# Patient Record
Sex: Female | Born: 1950 | Race: White | Hispanic: No | Smoking: Current some day smoker
Health system: Southern US, Community
[De-identification: ages and names within clinical notes are randomized; demographics above are authoritative.]

## PROBLEM LIST (undated history)

## (undated) DIAGNOSIS — J449 Chronic obstructive pulmonary disease, unspecified: Secondary | ICD-10-CM

## (undated) DIAGNOSIS — R270 Ataxia, unspecified: Secondary | ICD-10-CM

## (undated) DIAGNOSIS — I10 Essential (primary) hypertension: Secondary | ICD-10-CM

## (undated) DIAGNOSIS — W19XXXA Unspecified fall, initial encounter: Secondary | ICD-10-CM

## (undated) DIAGNOSIS — M797 Fibromyalgia: Secondary | ICD-10-CM

## (undated) DIAGNOSIS — G8929 Other chronic pain: Secondary | ICD-10-CM

## (undated) DIAGNOSIS — R51 Headache: Secondary | ICD-10-CM

## (undated) DIAGNOSIS — M1990 Unspecified osteoarthritis, unspecified site: Secondary | ICD-10-CM

## (undated) DIAGNOSIS — M5382 Other specified dorsopathies, cervical region: Secondary | ICD-10-CM

## (undated) DIAGNOSIS — R519 Headache, unspecified: Secondary | ICD-10-CM

## (undated) DIAGNOSIS — R296 Repeated falls: Secondary | ICD-10-CM

## (undated) DIAGNOSIS — M6281 Muscle weakness (generalized): Secondary | ICD-10-CM

## (undated) HISTORY — DX: Unspecified osteoarthritis, unspecified site: M19.90

## (undated) HISTORY — DX: Fibromyalgia: M79.7

## (undated) HISTORY — DX: Essential (primary) hypertension: I10

---

## 1980-09-15 HISTORY — PX: CHOLECYSTECTOMY: SHX55

## 2001-10-26 ENCOUNTER — Encounter: Payer: Self-pay | Admitting: Family Medicine

## 2001-10-26 ENCOUNTER — Ambulatory Visit (HOSPITAL_COMMUNITY): Admission: RE | Admit: 2001-10-26 | Discharge: 2001-10-26 | Payer: Self-pay | Admitting: Family Medicine

## 2002-01-06 ENCOUNTER — Emergency Department (HOSPITAL_COMMUNITY): Admission: EM | Admit: 2002-01-06 | Discharge: 2002-01-06 | Payer: Self-pay | Admitting: Emergency Medicine

## 2002-01-06 ENCOUNTER — Encounter: Payer: Self-pay | Admitting: Emergency Medicine

## 2002-07-05 ENCOUNTER — Encounter (HOSPITAL_COMMUNITY): Admission: RE | Admit: 2002-07-05 | Discharge: 2002-08-04 | Payer: Self-pay | Admitting: Internal Medicine

## 2003-01-02 ENCOUNTER — Encounter: Payer: Self-pay | Admitting: Family Medicine

## 2003-01-02 ENCOUNTER — Ambulatory Visit (HOSPITAL_COMMUNITY): Admission: RE | Admit: 2003-01-02 | Discharge: 2003-01-02 | Payer: Self-pay | Admitting: Family Medicine

## 2003-12-12 ENCOUNTER — Ambulatory Visit (HOSPITAL_COMMUNITY): Admission: RE | Admit: 2003-12-12 | Discharge: 2003-12-12 | Payer: Self-pay | Admitting: Family Medicine

## 2005-03-18 ENCOUNTER — Emergency Department (HOSPITAL_COMMUNITY): Admission: EM | Admit: 2005-03-18 | Discharge: 2005-03-18 | Payer: Self-pay | Admitting: Emergency Medicine

## 2005-03-21 ENCOUNTER — Ambulatory Visit (HOSPITAL_COMMUNITY): Admission: RE | Admit: 2005-03-21 | Discharge: 2005-03-21 | Payer: Self-pay | Admitting: Family Medicine

## 2005-03-24 ENCOUNTER — Ambulatory Visit: Payer: Self-pay | Admitting: Orthopedic Surgery

## 2005-05-05 ENCOUNTER — Ambulatory Visit: Payer: Self-pay | Admitting: Orthopedic Surgery

## 2005-07-14 ENCOUNTER — Ambulatory Visit: Payer: Self-pay | Admitting: Orthopedic Surgery

## 2005-07-27 ENCOUNTER — Emergency Department (HOSPITAL_COMMUNITY): Admission: EM | Admit: 2005-07-27 | Discharge: 2005-07-27 | Payer: Self-pay | Admitting: Emergency Medicine

## 2005-07-28 ENCOUNTER — Ambulatory Visit: Payer: Self-pay | Admitting: Orthopedic Surgery

## 2005-09-01 ENCOUNTER — Ambulatory Visit: Payer: Self-pay | Admitting: Orthopedic Surgery

## 2005-10-20 ENCOUNTER — Ambulatory Visit: Payer: Self-pay | Admitting: Orthopedic Surgery

## 2005-12-31 ENCOUNTER — Ambulatory Visit (HOSPITAL_COMMUNITY): Admission: RE | Admit: 2005-12-31 | Discharge: 2005-12-31 | Payer: Self-pay | Admitting: Family Medicine

## 2006-01-19 ENCOUNTER — Ambulatory Visit: Payer: Self-pay | Admitting: Orthopedic Surgery

## 2006-02-18 ENCOUNTER — Ambulatory Visit: Payer: Self-pay | Admitting: Internal Medicine

## 2006-02-18 ENCOUNTER — Emergency Department (HOSPITAL_COMMUNITY): Admission: EM | Admit: 2006-02-18 | Discharge: 2006-02-18 | Payer: Self-pay | Admitting: Emergency Medicine

## 2006-03-02 ENCOUNTER — Ambulatory Visit (HOSPITAL_COMMUNITY): Admission: RE | Admit: 2006-03-02 | Discharge: 2006-03-02 | Payer: Self-pay | Admitting: Family Medicine

## 2006-04-06 ENCOUNTER — Ambulatory Visit: Payer: Self-pay | Admitting: Orthopedic Surgery

## 2006-08-24 ENCOUNTER — Ambulatory Visit: Payer: Self-pay | Admitting: Orthopedic Surgery

## 2007-05-05 ENCOUNTER — Ambulatory Visit (HOSPITAL_COMMUNITY): Admission: RE | Admit: 2007-05-05 | Discharge: 2007-05-05 | Payer: Self-pay | Admitting: Family Medicine

## 2007-10-04 ENCOUNTER — Ambulatory Visit (HOSPITAL_COMMUNITY): Admission: RE | Admit: 2007-10-04 | Discharge: 2007-10-04 | Payer: Self-pay | Admitting: Family Medicine

## 2008-10-02 ENCOUNTER — Ambulatory Visit: Payer: Self-pay | Admitting: Orthopedic Surgery

## 2008-10-02 DIAGNOSIS — M25519 Pain in unspecified shoulder: Secondary | ICD-10-CM

## 2008-10-02 DIAGNOSIS — M7580 Other shoulder lesions, unspecified shoulder: Secondary | ICD-10-CM

## 2008-10-09 ENCOUNTER — Telehealth: Payer: Self-pay | Admitting: Orthopedic Surgery

## 2008-12-12 ENCOUNTER — Ambulatory Visit (HOSPITAL_COMMUNITY): Admission: RE | Admit: 2008-12-12 | Discharge: 2008-12-12 | Payer: Self-pay | Admitting: Family Medicine

## 2009-02-19 ENCOUNTER — Ambulatory Visit (HOSPITAL_COMMUNITY): Admission: RE | Admit: 2009-02-19 | Discharge: 2009-02-19 | Payer: Self-pay | Admitting: Family Medicine

## 2009-09-18 ENCOUNTER — Ambulatory Visit (HOSPITAL_COMMUNITY): Admission: RE | Admit: 2009-09-18 | Discharge: 2009-09-18 | Payer: Self-pay | Admitting: Family Medicine

## 2010-06-26 ENCOUNTER — Ambulatory Visit (HOSPITAL_COMMUNITY): Admission: RE | Admit: 2010-06-26 | Discharge: 2010-06-26 | Payer: Self-pay | Admitting: Podiatry

## 2010-10-06 ENCOUNTER — Encounter: Payer: Self-pay | Admitting: Orthopedic Surgery

## 2010-10-06 ENCOUNTER — Encounter: Payer: Self-pay | Admitting: Family Medicine

## 2010-10-15 NOTE — Assessment & Plan Note (Signed)
Summary: RT SHOULDER PAIN/NEEDS XRAY/BCBS/CAF    History of Present Illness: I saw Michelle Sanders in the office today for a followup visit.  She is a 60 years old woman with the complaint of:  right shoulder pain.   s/p clavicle fracture 2006.  Was given Lorcet plus, injection, advised needed MRI, declined due to finances. DX RC syndrome.  Last xrays [facility] 03/02/2006.  she is complaining of problems sleeping at night such as sleeping on her RIGHT side, problems raising and lowering the shoulder joint as well as difficulty lifting objects away from the body.  Last week she had increased pain after having to pull her dryer away from the wall and she thought it was on fire.  She is also noted to have an associated symptoms of tingling in the RIGHT upper extremity and hand    Current Allergies: ! PREDNISONE  Past Medical History:    Hypo-thyroid    Chronic back pain    Osteoarthritis    Anxiety    Painful joints & muscles (worse under stress)    Swelling ankles & legs    htn     Review of Systems  Neuro      Complains of numbness.    Shoulder/Elbow Exam  General:    Well-developed, well-nourished, normal body habitus; no deformities, normal grooming.    Skin:    Intact, no scars, lesions, rashes, cafe au lait spots or bruising.    Inspection:    Inspection is normal.    Palpation:    the previously notedclavicle fracture is nontender there is bony overgrowth there  Vascular:    Radial, ulnar, brachial, and axillary pulses 2+ and symmetric; capillary refill less than 2 seconds; no evidence of ischemia, clubbing, or cyanosis.    Sensory:    Gross sensation intact in the upper extremities.    Motor:    muscle tone is normal  Reflexes:    Normal reflexes in the upper extremities.    Shoulder Exam:    Right:    Inspection:  Abnormal    Palpation:  Abnormal    Stability:  stable    Range of Motion:       Flexion-Active: 95       External Rotation :  50  Impingement Sign NEER:    Right positive    Impression & Recommendations:  Problem # 1:  IMPINGEMENT SYNDROME (ICD-726.2)  Orders: Est. Patient Level III (16109)   Problem # 2:  SHOULDER PAIN (ICD-719.41) 2 RIGHT shoulder  Over the RIGHT clavicle fracture healed with bony callus formation no gross displacement or deformity. Glenohumeral joint normal.  Impression: Normal RIGHT shoulder was previously noted clavicle fracture healed in good position.   Verbal consent obtained/The shoulder was injected with depomedrol 40mg /cc and sensorcaine .25% . There were no complications  Orders: Est. Patient Level III (60454) Depo- Medrol 40mg  (J1030) Lidocaine Injection hcl 10 mg (J2001) Joint Aspirate / Injection, Large (20610) Shoulder x-ray,  minimum 2 views (09811)   Medications Added to Medication List This Visit: 1)  Lisinopril 5 Mg Tabs (Lisinopril) 2)  Synthroid 75 Mcg Tabs (Levothyroxine sodium) 3)  Alprazolam 0.5 Mg Tabs (Alprazolam) 4)  Nexium 40 Mg Cpdr (Esomeprazole magnesium)   Patient Instructions: 1)  You have received an injection of cortisone today. You may experience increased pain at the injection site. Apply ice pack to the area for 20 minutes every 2 hours and take 2 xtra strength tylenol every 8 hours. This increased pain  will usually resolve in 24 hours. The injection will take effect in 3-10 days.  2)  MRI 3)  DR WILL CALL THE RESULTS TO YOU

## 2011-01-31 NOTE — Op Note (Signed)
NAME:  Michelle Sanders, Michelle Sanders              ACCOUNT NO.:  [id number]   MEDICAL RECORD NO.:  [id number]          PATIENT TYPE:  EMS   LOCATION:  ED                            FACILITY:  [facility]   PHYSICIAN:  R. Roetta Sessions, M.D. DATE OF BIRTH:  Aug 28, 1951   DATE OF PROCEDURE:  02/18/2006  DATE OF DISCHARGE:  02/18/2006                                 OPERATIVE REPORT   PROCEDURE PERFORMED:  Emergency esophagogastroduodenoscopy with removal of  esophageal food impaction.   INDICATIONS FOR PROCEDURE:  The patient is a 60 year old lady who swallowed  some roast beef yesterday at lunch time and felt the roast beef lodge.  She  has not been able to swallow anything including liquids or even her saliva.  She presents to the emergency department this afternoon, saw Rhae Lerner.  Margretta Ditty, M.D., who called me and emergent EGD is now being done for  disimpaction.  This approach has been discussed with the patient at length,  potential risks, benefits and alternatives have been reviewed.  She does  cite intermittent episodes of transient food impaction symptoms.  She really  does not have any reflux symptoms.  Please see my H&P for more information.   Oxygen saturations, blood pressure, pulse and respirations were monitored  throughout the entirety of the procedure.   CONSCIOUS SEDATION:  Versed 4 mg IV, Demerol 75 mg IV in divided doses.  Cetacaine spray gargled and swallowed for topical oropharyngeal anesthesia.   INSTRUMENT USED:  Olympus video chip system.   FINDINGS:  Examination of the tubular esophagus revealed a large food bolus.  Initially, I got up against it with the tip of the scope and gently applied  pressure.  The bolus was tight and would not move distally, subsequently I  took a Lucina Mellow net and took several pieces out.  We subsequently used a 4-wire  basket and got a couple of other pieces out and subsequently was able to  approximate the scope against the residual meat remaining and was  able to  easily push it into the stomach.  There was food debris in the stomach which  precluded complete examination.  There did appear to be a noncritical  Schatzki ring and a large hiatal hernia.  The esophageal mucosa was somewhat  macerated distally but appeared normal otherwise.  The procedure was then  terminated.  The patient tolerated the procedure well.   IMPRESSION:  Large esophageal meat impaction, status post disimpaction as  described above.  Schatzki ring, hiatal hernia.  Complete exam not  performed.   RECOMMENDATIONS:  Clear liquids this evening, may resume diet tomorrow.  Patient admonished to chew food thoroughly and have adequate liquids on hand  to assist with swallowing process.  Patient should take 30 minutes to eat.  She should consider returning for EGD with elective dilation of her ring.      Jonathon Bellows, M.D.  Electronically Signed     RMR/MEDQ  D:  02/18/2006  T:  02/19/2006  Job:  [id number]   cc:   Kirk Ruths, M.D.  Fax: [phone]

## 2012-07-27 ENCOUNTER — Ambulatory Visit (HOSPITAL_COMMUNITY)
Admission: RE | Admit: 2012-07-27 | Discharge: 2012-07-27 | Disposition: A | Discharge status: Home / Self Care | Payer: Self-pay | Source: Ambulatory Visit | Attending: Family Medicine | Admitting: Family Medicine

## 2012-07-27 ENCOUNTER — Other Ambulatory Visit (HOSPITAL_COMMUNITY): Payer: Self-pay | Admitting: Family Medicine

## 2012-07-27 DIAGNOSIS — R059 Cough, unspecified: Secondary | ICD-10-CM | POA: Insufficient documentation

## 2012-07-27 DIAGNOSIS — R0989 Other specified symptoms and signs involving the circulatory and respiratory systems: Secondary | ICD-10-CM

## 2012-07-27 DIAGNOSIS — R05 Cough: Secondary | ICD-10-CM | POA: Insufficient documentation

## 2012-07-27 DIAGNOSIS — Z87891 Personal history of nicotine dependence: Secondary | ICD-10-CM | POA: Insufficient documentation

## 2012-07-27 DIAGNOSIS — G8929 Other chronic pain: Secondary | ICD-10-CM | POA: Insufficient documentation

## 2012-07-27 DIAGNOSIS — R0602 Shortness of breath: Secondary | ICD-10-CM | POA: Insufficient documentation

## 2012-07-27 DIAGNOSIS — R918 Other nonspecific abnormal finding of lung field: Secondary | ICD-10-CM | POA: Insufficient documentation

## 2013-01-10 ENCOUNTER — Other Ambulatory Visit (HOSPITAL_COMMUNITY): Payer: Self-pay | Admitting: Family Medicine

## 2013-01-10 DIAGNOSIS — R609 Edema, unspecified: Secondary | ICD-10-CM

## 2013-01-10 DIAGNOSIS — Z139 Encounter for screening, unspecified: Secondary | ICD-10-CM

## 2013-01-10 DIAGNOSIS — G8929 Other chronic pain: Secondary | ICD-10-CM

## 2013-01-17 ENCOUNTER — Ambulatory Visit (HOSPITAL_COMMUNITY): Payer: BC Managed Care – PPO

## 2013-01-31 ENCOUNTER — Other Ambulatory Visit (HOSPITAL_COMMUNITY): Payer: Self-pay

## 2013-02-28 ENCOUNTER — Other Ambulatory Visit (HOSPITAL_COMMUNITY): Payer: Self-pay | Admitting: Family Medicine

## 2013-02-28 ENCOUNTER — Ambulatory Visit (HOSPITAL_COMMUNITY)
Admission: RE | Admit: 2013-02-28 | Discharge: 2013-02-28 | Disposition: A | Discharge status: Home / Self Care | Payer: BC Managed Care – PPO | Source: Ambulatory Visit | Attending: Family Medicine | Admitting: Family Medicine

## 2013-02-28 DIAGNOSIS — R0602 Shortness of breath: Secondary | ICD-10-CM | POA: Insufficient documentation

## 2013-02-28 DIAGNOSIS — J810 Acute pulmonary edema: Secondary | ICD-10-CM

## 2013-02-28 DIAGNOSIS — J449 Chronic obstructive pulmonary disease, unspecified: Secondary | ICD-10-CM

## 2013-02-28 DIAGNOSIS — J4489 Other specified chronic obstructive pulmonary disease: Secondary | ICD-10-CM | POA: Insufficient documentation

## 2013-12-05 ENCOUNTER — Other Ambulatory Visit (HOSPITAL_COMMUNITY): Payer: Self-pay | Admitting: Family Medicine

## 2013-12-05 DIAGNOSIS — Z139 Encounter for screening, unspecified: Secondary | ICD-10-CM

## 2013-12-05 DIAGNOSIS — G8929 Other chronic pain: Secondary | ICD-10-CM

## 2013-12-13 ENCOUNTER — Other Ambulatory Visit (HOSPITAL_COMMUNITY): Payer: BC Managed Care – PPO

## 2014-02-13 ENCOUNTER — Other Ambulatory Visit (HOSPITAL_COMMUNITY): Payer: Self-pay | Admitting: Family Medicine

## 2014-02-13 DIAGNOSIS — Z6827 Body mass index (BMI) 27.0-27.9, adult: Secondary | ICD-10-CM

## 2014-07-03 ENCOUNTER — Other Ambulatory Visit (HOSPITAL_COMMUNITY): Payer: Self-pay | Admitting: Family Medicine

## 2014-07-03 DIAGNOSIS — R0989 Other specified symptoms and signs involving the circulatory and respiratory systems: Secondary | ICD-10-CM

## 2014-07-03 DIAGNOSIS — I739 Peripheral vascular disease, unspecified: Secondary | ICD-10-CM

## 2014-07-03 DIAGNOSIS — F17200 Nicotine dependence, unspecified, uncomplicated: Secondary | ICD-10-CM

## 2014-07-03 DIAGNOSIS — R52 Pain, unspecified: Secondary | ICD-10-CM

## 2014-07-10 ENCOUNTER — Ambulatory Visit (HOSPITAL_COMMUNITY)
Admission: RE | Admit: 2014-07-10 | Discharge: 2014-07-10 | Disposition: A | Discharge status: Home / Self Care | Payer: BC Managed Care – PPO | Source: Ambulatory Visit | Attending: Family Medicine | Admitting: Family Medicine

## 2014-07-10 ENCOUNTER — Other Ambulatory Visit (HOSPITAL_COMMUNITY): Payer: Self-pay | Admitting: Family Medicine

## 2014-07-10 DIAGNOSIS — F17200 Nicotine dependence, unspecified, uncomplicated: Secondary | ICD-10-CM

## 2014-07-10 DIAGNOSIS — R0989 Other specified symptoms and signs involving the circulatory and respiratory systems: Secondary | ICD-10-CM

## 2014-07-10 DIAGNOSIS — M79661 Pain in right lower leg: Secondary | ICD-10-CM | POA: Insufficient documentation

## 2014-07-10 DIAGNOSIS — R52 Pain, unspecified: Secondary | ICD-10-CM

## 2014-07-10 DIAGNOSIS — R252 Cramp and spasm: Secondary | ICD-10-CM | POA: Diagnosis not present

## 2014-07-10 DIAGNOSIS — I739 Peripheral vascular disease, unspecified: Secondary | ICD-10-CM

## 2014-07-10 DIAGNOSIS — M79662 Pain in left lower leg: Secondary | ICD-10-CM | POA: Insufficient documentation

## 2014-07-10 DIAGNOSIS — Z87891 Personal history of nicotine dependence: Secondary | ICD-10-CM | POA: Insufficient documentation

## 2015-06-06 ENCOUNTER — Other Ambulatory Visit (HOSPITAL_COMMUNITY): Payer: Self-pay | Admitting: Physician Assistant

## 2015-06-06 DIAGNOSIS — Z13820 Encounter for screening for osteoporosis: Secondary | ICD-10-CM

## 2015-06-06 DIAGNOSIS — Z1231 Encounter for screening mammogram for malignant neoplasm of breast: Secondary | ICD-10-CM

## 2015-06-20 ENCOUNTER — Ambulatory Visit (HOSPITAL_COMMUNITY): Payer: Self-pay

## 2015-06-20 ENCOUNTER — Other Ambulatory Visit (HOSPITAL_COMMUNITY): Payer: Self-pay

## 2015-07-30 ENCOUNTER — Other Ambulatory Visit (HOSPITAL_COMMUNITY): Payer: Self-pay | Admitting: Internal Medicine

## 2015-07-30 DIAGNOSIS — M4802 Spinal stenosis, cervical region: Secondary | ICD-10-CM

## 2015-08-07 ENCOUNTER — Ambulatory Visit (HOSPITAL_COMMUNITY)
Admission: RE | Admit: 2015-08-07 | Discharge: 2015-08-07 | Disposition: A | Discharge status: Home / Self Care | Payer: PPO | Source: Ambulatory Visit | Attending: Internal Medicine | Admitting: Internal Medicine

## 2015-08-07 DIAGNOSIS — M542 Cervicalgia: Secondary | ICD-10-CM | POA: Diagnosis present

## 2015-08-07 DIAGNOSIS — M4802 Spinal stenosis, cervical region: Secondary | ICD-10-CM | POA: Insufficient documentation

## 2015-08-07 DIAGNOSIS — M5031 Other cervical disc degeneration,  high cervical region: Secondary | ICD-10-CM | POA: Insufficient documentation

## 2015-08-07 DIAGNOSIS — R531 Weakness: Secondary | ICD-10-CM | POA: Diagnosis not present

## 2015-08-07 DIAGNOSIS — M79601 Pain in right arm: Secondary | ICD-10-CM | POA: Diagnosis not present

## 2015-08-07 DIAGNOSIS — M79602 Pain in left arm: Secondary | ICD-10-CM | POA: Insufficient documentation

## 2015-08-16 ENCOUNTER — Other Ambulatory Visit (HOSPITAL_COMMUNITY): Payer: Self-pay

## 2015-09-24 DIAGNOSIS — Z6824 Body mass index (BMI) 24.0-24.9, adult: Secondary | ICD-10-CM | POA: Diagnosis not present

## 2015-09-24 DIAGNOSIS — G459 Transient cerebral ischemic attack, unspecified: Secondary | ICD-10-CM | POA: Diagnosis not present

## 2015-09-24 DIAGNOSIS — E063 Autoimmune thyroiditis: Secondary | ICD-10-CM | POA: Diagnosis not present

## 2015-09-24 DIAGNOSIS — G894 Chronic pain syndrome: Secondary | ICD-10-CM | POA: Diagnosis not present

## 2015-09-24 DIAGNOSIS — J449 Chronic obstructive pulmonary disease, unspecified: Secondary | ICD-10-CM | POA: Diagnosis not present

## 2015-09-24 DIAGNOSIS — I10 Essential (primary) hypertension: Secondary | ICD-10-CM | POA: Diagnosis not present

## 2015-09-24 DIAGNOSIS — G454 Transient global amnesia: Secondary | ICD-10-CM | POA: Diagnosis not present

## 2015-09-24 DIAGNOSIS — K219 Gastro-esophageal reflux disease without esophagitis: Secondary | ICD-10-CM | POA: Diagnosis not present

## 2015-09-24 DIAGNOSIS — F419 Anxiety disorder, unspecified: Secondary | ICD-10-CM | POA: Diagnosis not present

## 2015-09-24 DIAGNOSIS — Z1389 Encounter for screening for other disorder: Secondary | ICD-10-CM | POA: Diagnosis not present

## 2015-09-24 DIAGNOSIS — E782 Mixed hyperlipidemia: Secondary | ICD-10-CM | POA: Diagnosis not present

## 2015-10-02 ENCOUNTER — Encounter: Payer: Self-pay | Admitting: *Deleted

## 2015-10-02 ENCOUNTER — Encounter: Payer: Self-pay | Admitting: Neurology

## 2015-10-02 ENCOUNTER — Ambulatory Visit (INDEPENDENT_AMBULATORY_CARE_PROVIDER_SITE_OTHER): Payer: PPO | Admitting: Neurology

## 2015-10-02 VITALS — BP 111/58 | HR 92 | Ht 60.0 in | Wt 132.0 lb

## 2015-10-02 DIAGNOSIS — M5382 Other specified dorsopathies, cervical region: Secondary | ICD-10-CM

## 2015-10-02 DIAGNOSIS — M6289 Other specified disorders of muscle: Secondary | ICD-10-CM | POA: Diagnosis not present

## 2015-10-02 DIAGNOSIS — F05 Delirium due to known physiological condition: Secondary | ICD-10-CM | POA: Diagnosis not present

## 2015-10-02 DIAGNOSIS — F32A Depression, unspecified: Secondary | ICD-10-CM | POA: Insufficient documentation

## 2015-10-02 DIAGNOSIS — F329 Major depressive disorder, single episode, unspecified: Secondary | ICD-10-CM

## 2015-10-02 DIAGNOSIS — E039 Hypothyroidism, unspecified: Secondary | ICD-10-CM | POA: Insufficient documentation

## 2015-10-02 DIAGNOSIS — F411 Generalized anxiety disorder: Secondary | ICD-10-CM

## 2015-10-02 DIAGNOSIS — R270 Ataxia, unspecified: Secondary | ICD-10-CM

## 2015-10-02 DIAGNOSIS — W19XXXA Unspecified fall, initial encounter: Secondary | ICD-10-CM | POA: Diagnosis not present

## 2015-10-02 DIAGNOSIS — R200 Anesthesia of skin: Secondary | ICD-10-CM | POA: Diagnosis not present

## 2015-10-02 DIAGNOSIS — J449 Chronic obstructive pulmonary disease, unspecified: Secondary | ICD-10-CM | POA: Insufficient documentation

## 2015-10-02 DIAGNOSIS — M6281 Muscle weakness (generalized): Secondary | ICD-10-CM | POA: Insufficient documentation

## 2015-10-02 DIAGNOSIS — M797 Fibromyalgia: Secondary | ICD-10-CM

## 2015-10-02 DIAGNOSIS — G894 Chronic pain syndrome: Secondary | ICD-10-CM | POA: Diagnosis not present

## 2015-10-02 NOTE — Patient Instructions (Signed)
Remember to drink plenty of fluid, eat healthy meals and do not skip any meals. Try to eat protein with a every meal and eat a healthy snack such as fruit or nuts in between meals. Try to keep a regular sleep-wake schedule and try to exercise daily, particularly in the form of walking, 20-30 minutes a day, if you can.   As far as diagnostic testing: Labs, MRI of the brain  I would like to see you back for emg/ncs, sooner if we need to. Please call us with any interim questions, concerns, problems, updates or refill requests.    Our phone number is [phone]. We also have an after hours call service for urgent matters and there is a physician on-call for urgent questions. For any emergencies you know to call 911 or go to the nearest emergency room

## 2015-10-02 NOTE — Progress Notes (Signed)
[facility]    Provider:  Dr Jaynee Eagles Referring Provider: Dr. Gerarda Fraction Primary Care Physician:  Glo Herring., MD  CC:  Gait abnormality  HPI:  Michelle Sanders is a 65 y.o. female here as a referral from Dr. Ethlyn Gallery for gait abnormality. PMHx Fibromyalgia, chronic pain, anxiety, depression.  hypothyroidism, HTN, HLD, COPD.  She started having problems with her neck last year and now she can't raise her head or lock her head. A lot of inflammation and swelling in the neck. Now her hands are swelling. At the end of November her hands got worse, she is having terrible pain in her hands. In the beginning of December she is so weak she could not get up and walk. She woke up one morning and she could not remember how to do anything, she literally could not remember how to dress or do anything. She remembers the incident. She was not alarmed. She could not dial a phone to call anyone. She couldn't get her clothes on. That night her neighbor saw the lights on in her car and the neighbor found her in the car trying to dial her phone. Patient remembers the whole incident. She was confused for a 24 to 36 hours period and the next morning her sister took her to the doctor the next morning. She needed help getting dressed, she couldn't drive. She was extensively tested for the incident. No history of seizures. 2 weeks prior she wasn't eating, she doesn't know why and she lost weight. No falls. She is staggering and her hands are still numb especially on her right side. The tips are numb and feel cold. No vision problems, no headaches, no dysarthria, no aphasia, no other weakness just the hands, she wakes with numbness in the hands and more on the right side.   Reviewed notes, labs and imaging from outside physicians, which showed:  MRI of the cervical spine 07/2015: personally reviewed images and agree with the following; Normal alignment. Negative for fracture or mass lesion. Spinal cord  signal normal.  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  C1-2: Posterior tilting of the dens causing mild spinal stenosis  C2-3: Mild disc degeneration and uncinate spurring without significant spinal stenosis  C3-4: Disc degeneration with diffuse uncinate spurring. Mild spinal stenosis. No significant foraminal stenosis  C4-5: Mild disc degeneration and uncinate spurring. Mild spinal stenosis. Neural foramina appear patent.  C5-6: Moderate disc degeneration with disc space narrowing and diffuse uncinate spurring. 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 Superimposed disc degeneration and spurring causing mild spinal stenosis at C1-2, C3-4, C4-5, and C5-6. No acute disc protrusion.   Review of Systems: Patient complains of symptoms per HPI as well as the following symptoms: Weight loss, fatigue, feeling hot, feeling cold, swelling in legs, joint pain, joint swelling, cramps, aching muscles, memory loss, confusion, numbness, weakness, sleepiness, dizziness, tremor, anxiety, too much sleep, ace energy. Pertinent negatives per HPI. All others negative.   Social History   Social History  . Marital Status: Divorced    Spouse Name: N/A  . Number of Children: 1  . Years of Education: 13   Occupational History  . Self-employed    Social History Main Topics  . Smoking status: Current Some Day Smoker  . Smokeless tobacco: Not on file  . Alcohol Use: No  . Drug Use: No  .  Sexual Activity: Not on file   Other Topics Concern  . Not on file   Social History Narrative   Lives alone   Caffeine use: 3 cups coffee per day    Family History  Problem Relation Age of Onset  . Stroke Sister   . Arthritis    . Neuropathy Neg Hx     Past Medical History  Diagnosis Date  . Fibromyalgia   . Hypertension    . Arthritis     Past Surgical History  Procedure Laterality Date  . Cholecystectomy  1982  . Cesarean section  1982    Current Outpatient Prescriptions  Medication Sig Dispense Refill  . albuterol (PROVENTIL HFA;VENTOLIN HFA) 108 (90 Base) MCG/ACT inhaler Inhale 2 puffs into the lungs every 6 (six) hours as needed for wheezing or shortness of breath.    . ALPRAZolam (XANAX) 1 MG tablet Take 1 mg by mouth 4 (four) times daily.    Marland Kitchen aspirin 81 MG tablet Take 81 mg by mouth daily.    . cyclobenzaprine (FLEXERIL) 10 MG tablet Take 10 mg by mouth 3 (three) times daily as needed for muscle spasms.    Marland Kitchen esomeprazole (NEXIUM) 40 MG capsule Take 40 mg by mouth daily at 12 noon.    . furosemide (LASIX) 20 MG tablet Take 20 mg by mouth daily as needed.    Marland Kitchen HYDROmorphone (DILAUDID) 8 MG tablet Take 4 mg by mouth every 4 (four) hours as needed for severe pain.     Marland Kitchen levothyroxine (SYNTHROID, LEVOTHROID) 100 MCG tablet Take 100 mcg by mouth daily before breakfast.    . lisinopril (PRINIVIL,ZESTRIL) 30 MG tablet Take 30 mg by mouth daily.    . budesonide-formoterol (SYMBICORT) 160-4.5 MCG/ACT inhaler Inhale 2 puffs into the lungs 2 (two) times daily. Reported on 10/02/2015     No current facility-administered medications for this visit.    Allergies as of 10/02/2015 - Review Complete 10/02/2015  Allergen Reaction Noted  . Prednisone    . Zanaflex [tizanidine hcl]  10/02/2015    Vitals: BP 111/58 mmHg  Pulse 92  Ht 5' (1.524 m)  Wt 132 lb (59.875 kg)  BMI 25.78 kg/m2 Last Weight:  Wt Readings from Last 1 Encounters:  10/02/15 132 lb (59.875 kg)   Last Height:   Ht Readings from Last 1 Encounters:  10/02/15 5' (1.524 m)    Physical exam: Exam: Gen: NAD, not well groomed with a heavy smell of cigarette smoke                   CV: RRR, no MRG. No Carotid Bruits. + peripheral edema, warm, nontender Eyes: Conjunctivae clear without exudates or hemorrhage  Neuro: Detailed Neurologic  Exam  Speech:    Speech is normal; fluent and spontaneous with normal comprehension.  Cognition:    The patient is oriented to person, place, and time;     recent and remote memory intact;     language fluent;     normal attention, concentration,     fund of knowledge Cranial Nerves:    The pupils are equal, round, and reactive to light. The fundi are flat. Visual fields are full to finger confrontation. Extraocular movements are intact. Trigeminal sensation is intact and the muscles of mastication are normal. The face is symmetric. The palate elevates in the midline. Hearing intact. Voice is normal. Shoulder shrug is normal. The tongue has normal motion without fasciculations.   Coordination:    Normal finger to  nose and heel to shin.   Gait:    She can get out of the seat without using her hands with mild difficulty. Mildly wide based and cautious gait  Motor Observation:    No asymmetry, no atrophy, and no involuntary movements noted. Tone:    Normal muscle tone.    Posture:    Posture is normal. normal erect    Strength: weakness of head extension. Proximal weakness.  Sensation: intact to LT     Reflex Exam:  DTR's: absent right AJ otherwise deep tendon reflexes in the upper and lower extremities are brisk bilaterally.   Toes:    The toes are equivocal bilaterally.   Clonus:    Clonus is absent.      Assessment/Plan:   65 y.o. female here as a referral from Dr. Ethlyn Gallery for gait abnormality. PMHx Fibromyalgia, chronic pain, anxiety, depression.  hypothyroidism, HTN, HLD, COPD.Marland Kitchen She had an extended confusional episode over several days. She also has proximal weakness on exam.  - MRI of the brain - Labs - 4-limb emg/ncs - discussed smokign cessation - physical therapy for gait and safety eval  CC: Dr. Maryann Conners, [facility] [address] [place], [place] [address]  Phone [phone] Fax [phone]

## 2015-10-10 ENCOUNTER — Telehealth: Payer: Self-pay | Admitting: *Deleted

## 2015-10-10 NOTE — Telephone Encounter (Signed)
Called and spoke to pt about lab results per Dr Jaynee Eagles note. Advised I will forward to her PCP. She is going to contact PCP to f/u.   Faxed copy of lab results to Dr Gerarda Fraction. Received confirmation. Fax: [phone].

## 2015-10-10 NOTE — Telephone Encounter (Signed)
-----   Message from Melvenia Beam, MD sent at 10/09/2015  5:21 PM EST ----- Terrence Dupont, patient's tsh was high. She is on synthroid and she may need to evaluated for medication increase. She should just follow up with her primary care or whoever manages her thyroid medication  thanks.

## 2015-10-12 LAB — COMPREHENSIVE METABOLIC PANEL
A/G RATIO: 1.1 (ref 1.1–2.5)
ALK PHOS: 103 IU/L (ref 39–117)
ALT: 6 IU/L (ref 0–32)
AST: 8 IU/L (ref 0–40)
Albumin: 3.1 g/dL — ABNORMAL LOW (ref 3.6–4.8)
BILIRUBIN TOTAL: 0.4 mg/dL (ref 0.0–1.2)
BUN/Creatinine Ratio: 22 (ref 11–26)
BUN: 15 mg/dL (ref 8–27)
CHLORIDE: 96 mmol/L (ref 96–106)
CO2: 29 mmol/L (ref 18–29)
Calcium: 9.1 mg/dL (ref 8.7–10.3)
Creatinine, Ser: 0.69 mg/dL (ref 0.57–1.00)
GFR calc non Af Amer: 92 mL/min/{1.73_m2} (ref >59)
GFR, EST AFRICAN AMERICAN: 106 mL/min/{1.73_m2} (ref >59)
Globulin, Total: 2.9 g/dL (ref 1.5–4.5)
Glucose: 80 mg/dL (ref 65–99)
POTASSIUM: 4.9 mmol/L (ref 3.5–5.2)
Sodium: 139 mmol/L (ref 134–144)
TOTAL PROTEIN: 6 g/dL (ref 6.0–8.5)

## 2015-10-12 LAB — ACETYLCHOLINE RECEPTOR, BLOCKING: Acetylchol Block Ab: 22 % (ref 0–25)

## 2015-10-12 LAB — ACETYLCHOLINE RECEPTOR, BINDING

## 2015-10-12 LAB — TSH: TSH: 6.11 u[IU]/mL — AB (ref 0.450–4.500)

## 2015-10-12 LAB — CK: CK TOTAL: 58 U/L (ref 24–173)

## 2015-10-12 LAB — ACETYLCHOLINE RECEPTOR, MODULATING: Acetylcholine Modulat Ab: <12 % (ref 0–20)

## 2015-10-12 LAB — VGCC ANTIBODY: VGCC ANTIBODY: NEGATIVE

## 2015-10-12 LAB — B12 AND FOLATE PANEL
FOLATE: 9.6 ng/mL (ref >3.0)
VITAMIN B 12: 327 pg/mL (ref 211–946)

## 2015-10-12 LAB — ANGIOTENSIN CONVERTING ENZYME: Angio Convert Enzyme: <15 U/L (ref 14–82)

## 2015-10-16 ENCOUNTER — Ambulatory Visit (HOSPITAL_COMMUNITY)
Admission: RE | Admit: 2015-10-16 | Discharge: 2015-10-16 | Disposition: A | Discharge status: Home / Self Care | Payer: PPO | Source: Ambulatory Visit | Attending: Neurology | Admitting: Neurology

## 2015-10-16 DIAGNOSIS — M6281 Muscle weakness (generalized): Secondary | ICD-10-CM | POA: Insufficient documentation

## 2015-10-16 DIAGNOSIS — Z9181 History of falling: Secondary | ICD-10-CM | POA: Insufficient documentation

## 2015-10-16 DIAGNOSIS — M6289 Other specified disorders of muscle: Secondary | ICD-10-CM | POA: Insufficient documentation

## 2015-10-16 DIAGNOSIS — M5382 Other specified dorsopathies, cervical region: Secondary | ICD-10-CM | POA: Diagnosis not present

## 2015-10-16 DIAGNOSIS — R200 Anesthesia of skin: Secondary | ICD-10-CM | POA: Insufficient documentation

## 2015-10-16 DIAGNOSIS — W19XXXA Unspecified fall, initial encounter: Secondary | ICD-10-CM

## 2015-10-16 DIAGNOSIS — F05 Delirium due to known physiological condition: Secondary | ICD-10-CM | POA: Insufficient documentation

## 2015-10-16 DIAGNOSIS — R270 Ataxia, unspecified: Secondary | ICD-10-CM | POA: Insufficient documentation

## 2015-10-16 DIAGNOSIS — I739 Peripheral vascular disease, unspecified: Secondary | ICD-10-CM | POA: Diagnosis not present

## 2015-10-16 DIAGNOSIS — G319 Degenerative disease of nervous system, unspecified: Secondary | ICD-10-CM | POA: Insufficient documentation

## 2015-10-16 DIAGNOSIS — R51 Headache: Secondary | ICD-10-CM | POA: Insufficient documentation

## 2015-10-16 DIAGNOSIS — M542 Cervicalgia: Secondary | ICD-10-CM | POA: Diagnosis not present

## 2015-10-16 MED ORDER — GADOBENATE DIMEGLUMINE 529 MG/ML IV SOLN
10.0000 mL | Freq: Once | INTRAVENOUS | Status: AC | PRN
  Administered 2015-10-16: 10 mL via INTRAVENOUS

## 2015-10-18 ENCOUNTER — Telehealth: Payer: Self-pay | Admitting: *Deleted

## 2015-10-18 NOTE — Telephone Encounter (Signed)
Called and relayed results per Dr Jaynee Eagles note. Pt verbalized understanding.

## 2015-10-18 NOTE — Telephone Encounter (Signed)
-----   Message from Melvenia Beam, MD sent at 10/17/2015  8:30 PM EST ----- MRi is normal for age. Her benign meningioma is again seen but causing no problems. thanks

## 2015-10-22 DIAGNOSIS — G894 Chronic pain syndrome: Secondary | ICD-10-CM | POA: Diagnosis not present

## 2015-10-22 DIAGNOSIS — E063 Autoimmune thyroiditis: Secondary | ICD-10-CM | POA: Diagnosis not present

## 2015-10-22 DIAGNOSIS — I10 Essential (primary) hypertension: Secondary | ICD-10-CM | POA: Diagnosis not present

## 2015-10-22 DIAGNOSIS — Z6823 Body mass index (BMI) 23.0-23.9, adult: Secondary | ICD-10-CM | POA: Diagnosis not present

## 2015-10-22 DIAGNOSIS — Z0000 Encounter for general adult medical examination without abnormal findings: Secondary | ICD-10-CM | POA: Diagnosis not present

## 2015-10-30 ENCOUNTER — Ambulatory Visit (INDEPENDENT_AMBULATORY_CARE_PROVIDER_SITE_OTHER): Payer: Self-pay | Admitting: Neurology

## 2015-10-30 ENCOUNTER — Ambulatory Visit (INDEPENDENT_AMBULATORY_CARE_PROVIDER_SITE_OTHER): Payer: PPO | Admitting: Neurology

## 2015-10-30 DIAGNOSIS — M6289 Other specified disorders of muscle: Secondary | ICD-10-CM | POA: Diagnosis not present

## 2015-10-30 DIAGNOSIS — M6281 Muscle weakness (generalized): Secondary | ICD-10-CM

## 2015-10-30 DIAGNOSIS — R200 Anesthesia of skin: Secondary | ICD-10-CM | POA: Diagnosis not present

## 2015-10-30 DIAGNOSIS — F05 Delirium due to known physiological condition: Secondary | ICD-10-CM

## 2015-10-30 DIAGNOSIS — M5382 Other specified dorsopathies, cervical region: Secondary | ICD-10-CM

## 2015-10-30 DIAGNOSIS — Z0289 Encounter for other administrative examinations: Secondary | ICD-10-CM

## 2015-10-30 DIAGNOSIS — R270 Ataxia, unspecified: Secondary | ICD-10-CM | POA: Diagnosis not present

## 2015-10-30 DIAGNOSIS — W19XXXA Unspecified fall, initial encounter: Secondary | ICD-10-CM

## 2015-10-30 NOTE — Progress Notes (Signed)
See procedure note.

## 2015-10-30 NOTE — Progress Notes (Signed)
  [facility]    Provider:  Dr Jaynee Eagles Referring Provider: Redmond School, MD Primary Care Physician:  Glo Herring., MD   HPI:  Michelle Sanders is a 65 y.o. female here as a referral from Dr. Ethlyn Gallery for gait abnormality. PMHx Fibromyalgia, chronic pain, anxiety, depression. hypothyroidism, HTN, HLD, COPD. She started having problems with her neck last year and now she can't raise her head or lock her head. A lot of inflammation and swelling in the neck. Now her hands are swelling. At the end of November her hands got worse, she is having terrible pain in her hands. In the beginning of December she is so weak she could not get up and walk. No falls. She is staggering and her hands are still numb. The tips are numb and feel cold. No vision problems, no headaches, no dysarthria, no aphasia, no other weakness just the hands, she wakes with numbness in the hands. Today she reports the symptoms are symmetric bilaterally, will perform emg on the left side, patient agrees.   I have ordered physical therapy but patient has not scheduled, she says they called and she hasn't called them yet.   Focused exam: Distally is intact to pin prick to the knees. Proprioception is intact in great toes. 5 seconds vibration bilaterally. She has some reported numbness in the feet but sensation clinically intact. No grip myotonia or percussion myotonia.   Summary  Nerve conduction studies were performed on the bilateral upper and lower extremities:  The bilateral Median motor nerves showed normal conductions with normal F Wave latency The bilateral Ulnar motor nerves showed normal conductions with normal F Wave latency The bilateral Peroneal motor nerves showed normal conductions with normal F Wave latency The bilateral Tibial motor nerves showed normal conductions with normal F Wave latency The bilateral second-digit Median sensory nerves were within normal limits The bilateral  fifth-digit Ulnar sensory nerves were within normal limits The bilateral Sural and Peroneal sensory nerves showed no response Bilateral H Reflexes showed normal latencies  EMG Needle study was performed on selected left upper and left lower extremity muscles:   Myotonic discharges were seen in 2 proximal muscles and a cervical paraspinal. Otherwise the following muscles normal: The left Deltoid, left Triceps, left Biceps, left Pronator Teres, left Opponens Pollicis, First Dorsal Interosseous, left Iliopsoas, left Vastus Medialis and lateralis,  left Anterior Tibialis, left Medial Gastrocnemius and left T10, C7/C8, C6/C7.  Could not sample muscles below the gastroc due to swelling and erythema below the knee.  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 No myopathic motor units. Few myotonic discharges were seen which are non-specific and can be seen in myotonic disorders or any denervating disorder. Asked patient to return to clinic for discussion of further workup such as muscle biopsy.

## 2015-11-14 ENCOUNTER — Telehealth: Payer: Self-pay | Admitting: Neurology

## 2015-11-14 NOTE — Telephone Encounter (Signed)
Call patient and ensure she has follow up

## 2015-11-15 DIAGNOSIS — Z6823 Body mass index (BMI) 23.0-23.9, adult: Secondary | ICD-10-CM | POA: Diagnosis not present

## 2015-11-15 DIAGNOSIS — E039 Hypothyroidism, unspecified: Secondary | ICD-10-CM | POA: Diagnosis not present

## 2015-11-26 NOTE — Telephone Encounter (Signed)
Called patient to ensure she has follow up with me regarding her EMG and her muscle pain. There was no answer and no voice mail. Will try again.

## 2016-01-05 NOTE — Telephone Encounter (Signed)
Michelle Sanders, I saw patient in the past, she was supposed to follow up with me to further examine her muscle pain and weakness. I have tried calling several times but no answering machine. Can you try for me again and if you can't reach her can you send a letter that I would really like to follow up with her and further work up her muscle complaints as we discussed. Thanks!

## 2016-01-07 ENCOUNTER — Encounter: Payer: Self-pay | Admitting: *Deleted

## 2016-01-07 NOTE — Telephone Encounter (Signed)
Tried calling pt per Dr Jaynee Eagles request. Phone continues to ring. Cannot LVM. Will send pt letter to call and make f/u.

## 2016-01-08 DIAGNOSIS — L03115 Cellulitis of right lower limb: Secondary | ICD-10-CM | POA: Diagnosis not present

## 2016-01-08 DIAGNOSIS — E663 Overweight: Secondary | ICD-10-CM | POA: Diagnosis not present

## 2016-01-08 DIAGNOSIS — E782 Mixed hyperlipidemia: Secondary | ICD-10-CM | POA: Diagnosis not present

## 2016-01-08 DIAGNOSIS — Z6826 Body mass index (BMI) 26.0-26.9, adult: Secondary | ICD-10-CM | POA: Diagnosis not present

## 2016-01-08 DIAGNOSIS — L03116 Cellulitis of left lower limb: Secondary | ICD-10-CM | POA: Diagnosis not present

## 2016-01-08 DIAGNOSIS — I872 Venous insufficiency (chronic) (peripheral): Secondary | ICD-10-CM | POA: Diagnosis not present

## 2016-01-08 DIAGNOSIS — G894 Chronic pain syndrome: Secondary | ICD-10-CM | POA: Diagnosis not present

## 2016-01-08 DIAGNOSIS — I10 Essential (primary) hypertension: Secondary | ICD-10-CM | POA: Diagnosis not present

## 2016-01-08 DIAGNOSIS — R600 Localized edema: Secondary | ICD-10-CM | POA: Diagnosis not present

## 2016-01-08 DIAGNOSIS — J449 Chronic obstructive pulmonary disease, unspecified: Secondary | ICD-10-CM | POA: Diagnosis not present

## 2016-01-08 DIAGNOSIS — L03119 Cellulitis of unspecified part of limb: Secondary | ICD-10-CM | POA: Diagnosis not present

## 2016-01-08 DIAGNOSIS — I8312 Varicose veins of left lower extremity with inflammation: Secondary | ICD-10-CM | POA: Diagnosis not present

## 2016-03-31 DIAGNOSIS — E782 Mixed hyperlipidemia: Secondary | ICD-10-CM | POA: Diagnosis not present

## 2016-03-31 DIAGNOSIS — Z6825 Body mass index (BMI) 25.0-25.9, adult: Secondary | ICD-10-CM | POA: Diagnosis not present

## 2016-03-31 DIAGNOSIS — E063 Autoimmune thyroiditis: Secondary | ICD-10-CM | POA: Diagnosis not present

## 2016-03-31 DIAGNOSIS — J449 Chronic obstructive pulmonary disease, unspecified: Secondary | ICD-10-CM | POA: Diagnosis not present

## 2016-03-31 DIAGNOSIS — G894 Chronic pain syndrome: Secondary | ICD-10-CM | POA: Diagnosis not present

## 2016-03-31 DIAGNOSIS — E663 Overweight: Secondary | ICD-10-CM | POA: Diagnosis not present

## 2016-03-31 DIAGNOSIS — I10 Essential (primary) hypertension: Secondary | ICD-10-CM | POA: Diagnosis not present

## 2016-06-20 DIAGNOSIS — E063 Autoimmune thyroiditis: Secondary | ICD-10-CM | POA: Diagnosis not present

## 2016-06-20 DIAGNOSIS — E782 Mixed hyperlipidemia: Secondary | ICD-10-CM | POA: Diagnosis not present

## 2016-06-20 DIAGNOSIS — Z6826 Body mass index (BMI) 26.0-26.9, adult: Secondary | ICD-10-CM | POA: Diagnosis not present

## 2016-06-20 DIAGNOSIS — G629 Polyneuropathy, unspecified: Secondary | ICD-10-CM | POA: Diagnosis not present

## 2016-06-20 DIAGNOSIS — I10 Essential (primary) hypertension: Secondary | ICD-10-CM | POA: Diagnosis not present

## 2016-06-20 DIAGNOSIS — F329 Major depressive disorder, single episode, unspecified: Secondary | ICD-10-CM | POA: Diagnosis not present

## 2016-06-20 DIAGNOSIS — M797 Fibromyalgia: Secondary | ICD-10-CM | POA: Diagnosis not present

## 2016-06-20 DIAGNOSIS — J449 Chronic obstructive pulmonary disease, unspecified: Secondary | ICD-10-CM | POA: Diagnosis not present

## 2016-06-20 DIAGNOSIS — E663 Overweight: Secondary | ICD-10-CM | POA: Diagnosis not present

## 2016-09-24 DIAGNOSIS — J449 Chronic obstructive pulmonary disease, unspecified: Secondary | ICD-10-CM | POA: Diagnosis not present

## 2016-09-24 DIAGNOSIS — G64 Other disorders of peripheral nervous system: Secondary | ICD-10-CM | POA: Diagnosis not present

## 2016-09-24 DIAGNOSIS — Z6827 Body mass index (BMI) 27.0-27.9, adult: Secondary | ICD-10-CM | POA: Diagnosis not present

## 2016-09-24 DIAGNOSIS — G894 Chronic pain syndrome: Secondary | ICD-10-CM | POA: Diagnosis not present

## 2016-09-24 DIAGNOSIS — Z1389 Encounter for screening for other disorder: Secondary | ICD-10-CM | POA: Diagnosis not present

## 2016-12-17 DIAGNOSIS — I10 Essential (primary) hypertension: Secondary | ICD-10-CM | POA: Diagnosis not present

## 2016-12-17 DIAGNOSIS — M40299 Other kyphosis, site unspecified: Secondary | ICD-10-CM | POA: Diagnosis not present

## 2016-12-17 DIAGNOSIS — G894 Chronic pain syndrome: Secondary | ICD-10-CM | POA: Diagnosis not present

## 2016-12-17 DIAGNOSIS — M797 Fibromyalgia: Secondary | ICD-10-CM | POA: Diagnosis not present

## 2016-12-17 DIAGNOSIS — Z6827 Body mass index (BMI) 27.0-27.9, adult: Secondary | ICD-10-CM | POA: Diagnosis not present

## 2016-12-17 DIAGNOSIS — E063 Autoimmune thyroiditis: Secondary | ICD-10-CM | POA: Diagnosis not present

## 2016-12-17 DIAGNOSIS — F419 Anxiety disorder, unspecified: Secondary | ICD-10-CM | POA: Diagnosis not present

## 2016-12-17 DIAGNOSIS — E663 Overweight: Secondary | ICD-10-CM | POA: Diagnosis not present

## 2017-03-13 DIAGNOSIS — Z1389 Encounter for screening for other disorder: Secondary | ICD-10-CM | POA: Diagnosis not present

## 2017-03-13 DIAGNOSIS — Z719 Counseling, unspecified: Secondary | ICD-10-CM | POA: Diagnosis not present

## 2017-03-13 DIAGNOSIS — Z6827 Body mass index (BMI) 27.0-27.9, adult: Secondary | ICD-10-CM | POA: Diagnosis not present

## 2017-03-13 DIAGNOSIS — F419 Anxiety disorder, unspecified: Secondary | ICD-10-CM | POA: Diagnosis not present

## 2017-03-13 DIAGNOSIS — G894 Chronic pain syndrome: Secondary | ICD-10-CM | POA: Diagnosis not present

## 2017-04-17 DIAGNOSIS — Z0000 Encounter for general adult medical examination without abnormal findings: Secondary | ICD-10-CM | POA: Diagnosis not present

## 2017-04-17 DIAGNOSIS — Z1389 Encounter for screening for other disorder: Secondary | ICD-10-CM | POA: Diagnosis not present

## 2017-04-17 DIAGNOSIS — E6609 Other obesity due to excess calories: Secondary | ICD-10-CM | POA: Diagnosis not present

## 2017-04-17 DIAGNOSIS — Z6832 Body mass index (BMI) 32.0-32.9, adult: Secondary | ICD-10-CM | POA: Diagnosis not present

## 2017-04-30 IMAGING — MR MR HEAD WO/W CM
7 of 12 series · 26 of 48 positions shown · IV contrast (multihance)
Comparison: Cervical spine MRI 08/07/2015.

CLINICAL DATA: Headache with neck pain and stiffness. Proximal
muscle weakness. Neck muscle weakness. Ataxia, falls, and acute
confusional state. Symptoms for 6 months.

EXAM:
MRI HEAD WITHOUT AND WITH CONTRAST
TECHNIQUE: Multiplanar, multiecho pulse sequences of the brain and surrounding
structures were obtained without and with intravenous contrast.
CONTRAST:  10mL MULTIHANCE GADOBENATE DIMEGLUMINE 529 MG/ML IV SOLN

[Series 3: t1_fl2d_sag · sagittal · 5.0mm · 0.43mm/px · 1 of 20 slices shown]
[im 1/20]
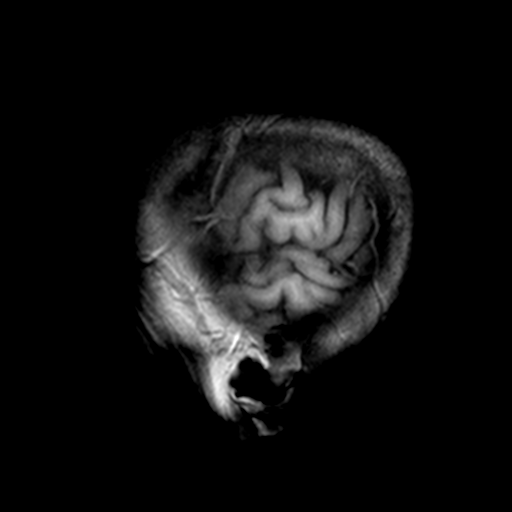

[Series 6: T2 · axial · 5.0mm · 0.75mm/px · z∈[-51,+90]mm · 2 of 23 slices shown (1 of 2)]
[im 1/23]
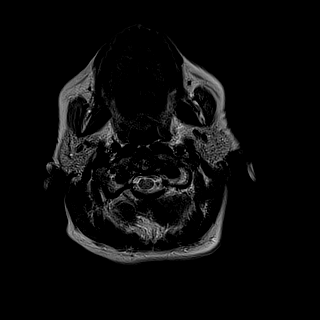
[im 23/23]
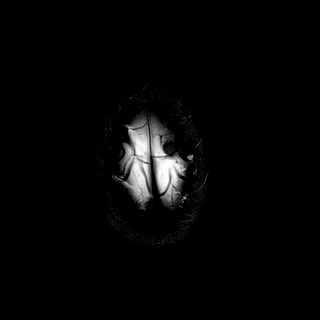

[Series 7: FLAIR · axial · 5.0mm · 0.94mm/px · z∈[-51,+90]mm · 2 of 23 slices shown]
[im 1/23]
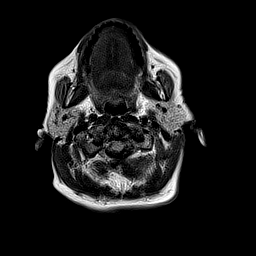
[im 23/23]
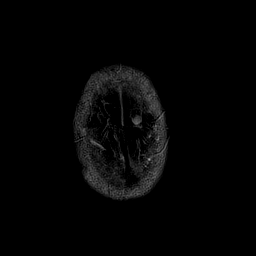

[Series 8: T1 · axial · 2.0mm · 0.45mm/px · z∈[-69,+116]mm · 8 of 95 slices shown]
[im 1/95]
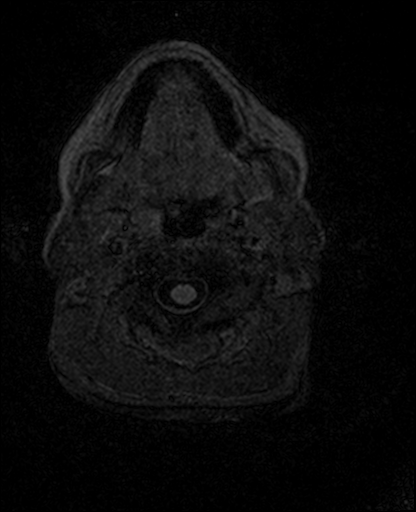
[im 12/95]
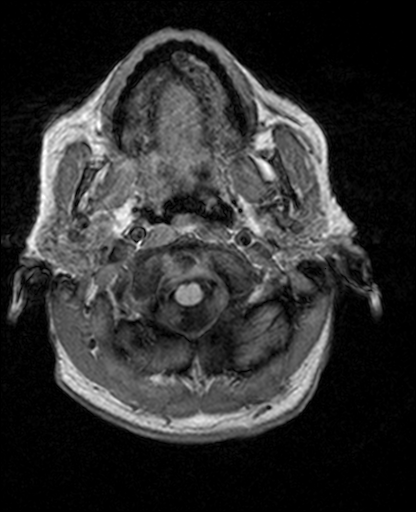
[im 24/95]
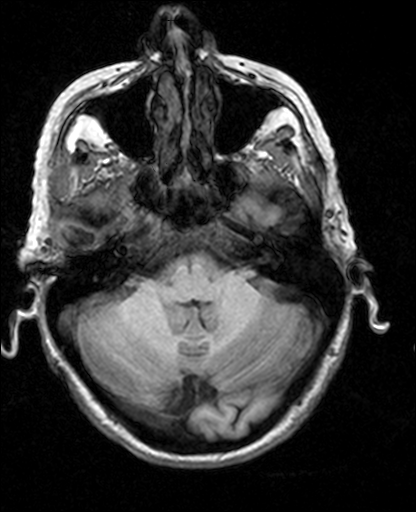
[im 36/95]
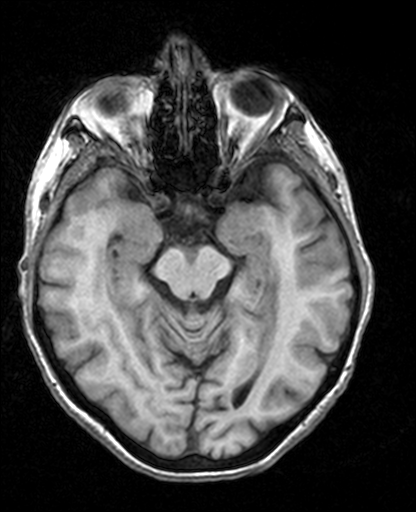
[im 59/95]
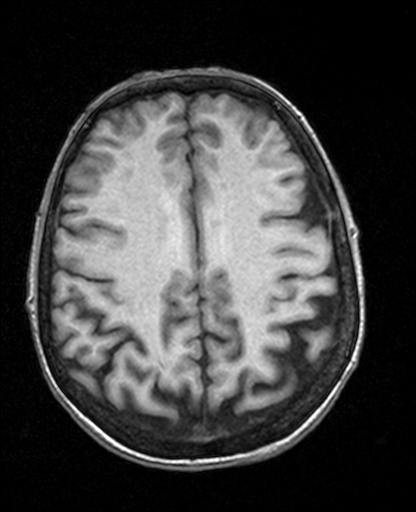
[im 71/95]
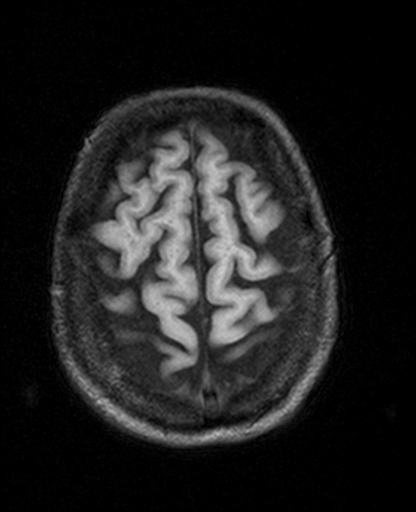
[im 83/95]
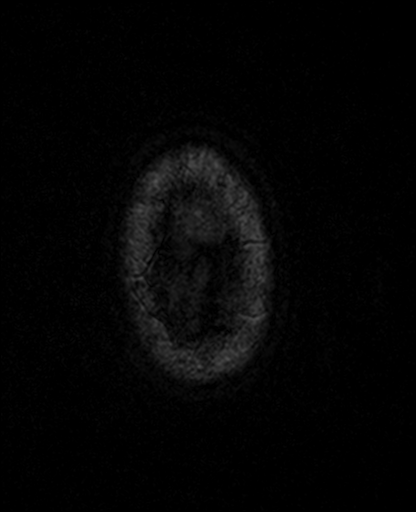
[im 95/95]
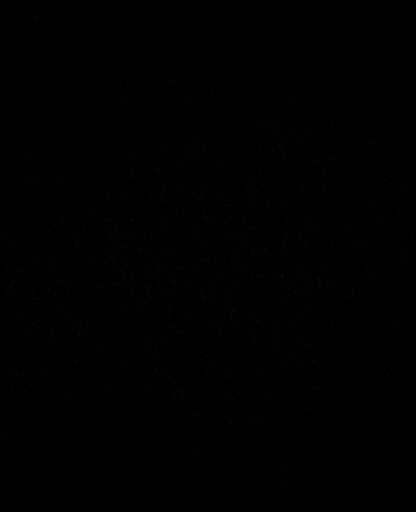

[Series 10: T2 · coronal · 5.0mm · 0.60mm/px · 2 of 24 slices shown (2 of 2)]
[im 1/24]
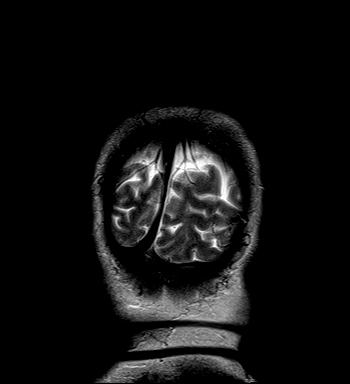
[im 24/24]
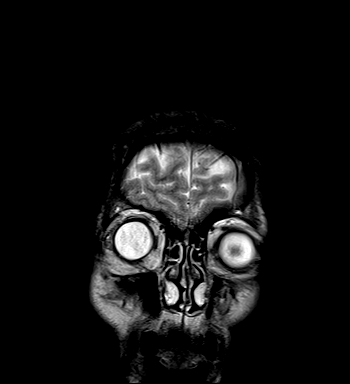

[Series 11: T1 post-contrast · axial · 2.0mm · 0.45mm/px · z∈[-69,+116]mm · 9 of 95 slices shown (1 of 2)]
[im 1/95]
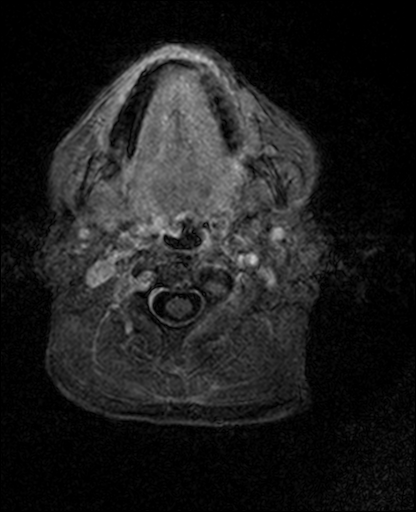
[im 12/95]
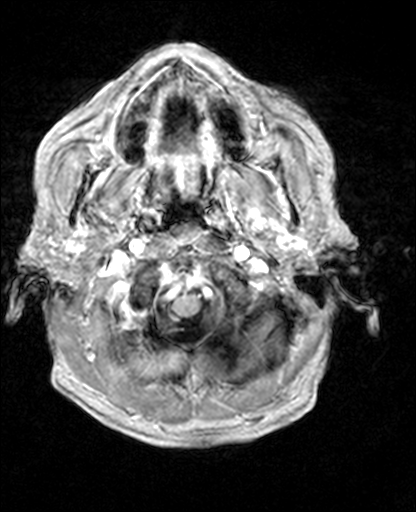
[im 24/95]
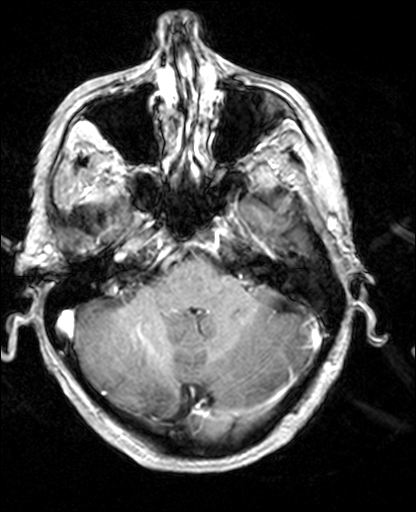
[im 36/95]
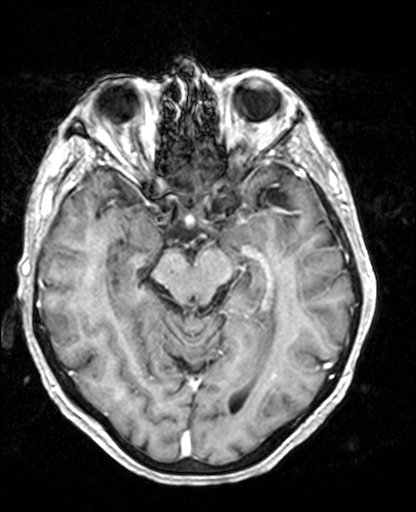
[im 48/95]
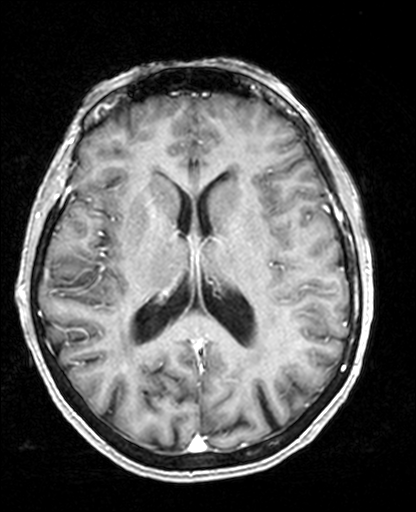
[im 59/95]
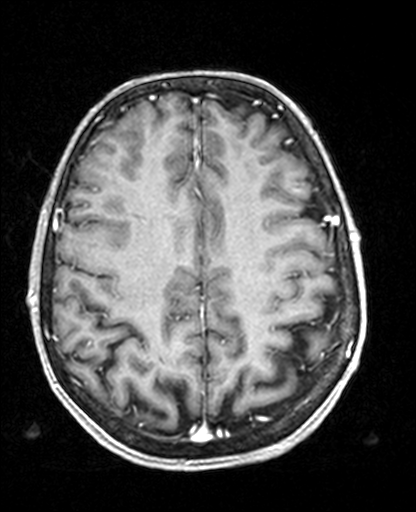
[im 71/95]
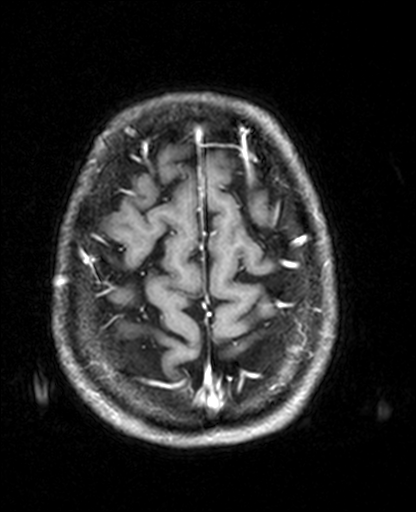
[im 83/95]
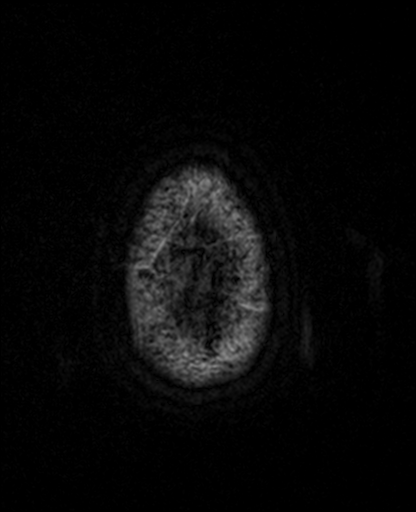
[im 95/95]
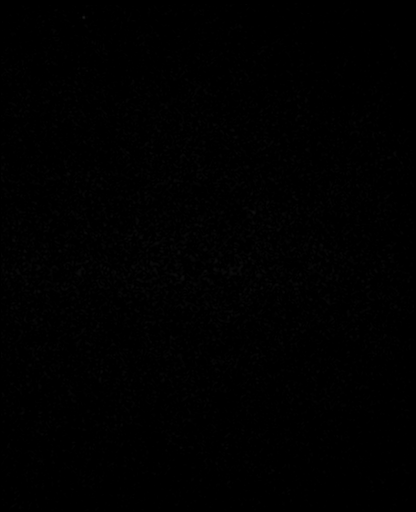

[Series 12: T1 post-contrast · coronal · 5.0mm · 0.42mm/px · 2 of 24 slices shown (2 of 2)]
[im 1/24]
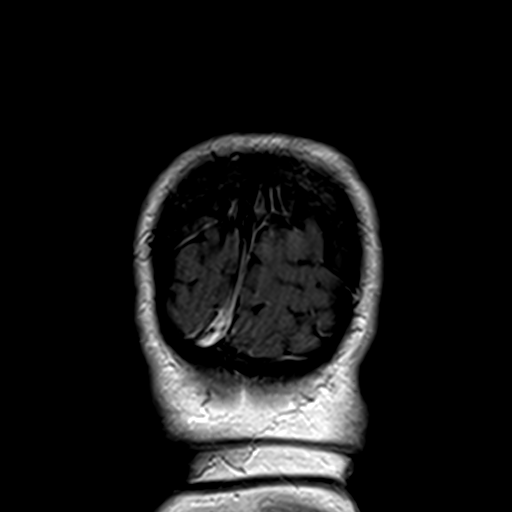
[im 24/24]
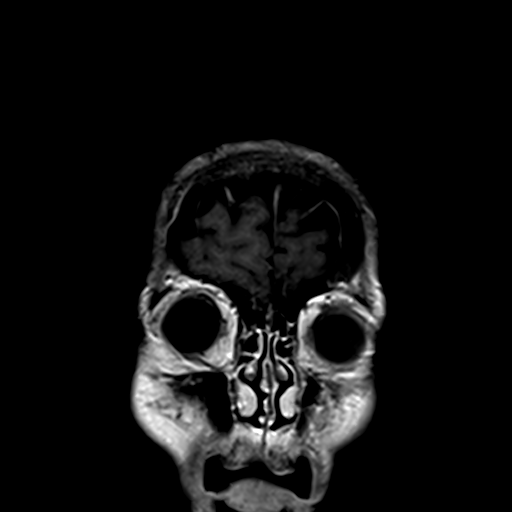

[26 of 48 positions shown; findings below may reference images not displayed]

FINDINGS: There is no evidence of acute infarct, intracranial hemorrhage,
intra-axial mass, midline shift, or extra-axial fluid collection.
There is mild cerebral atrophy which is most notable in the parietal
regions. Small foci of T2 hyperintensity in the subcortical and
periventricular cerebral white matter bilaterally are nonspecific
but compatible with mild chronic small vessel ischemic disease.

A small homogeneously enhancing extra-axial mass to the left of the
falx at the vertex in the posterior frontal region measures 10 x 7 x
8 mm (series 11, image 86 and series 12, image 16). There is no mass
effect on the underlying brain parenchyma, nor is there evidence of
edema. No abnormal enhancement is identified elsewhere.

Orbits are unremarkable. Paranasal sinuses and mastoid air cells are
clear. A 1.4 cm Tornwaldt cyst is noted. Major intracranial vascular
flow voids are preserved. Bone marrow signal is diminished diffusely
as seen on prior cervical spine MRI, nonspecific but can be seen in
the setting of anemia, smoking, and infiltrative marrow processes.
IMPRESSION: 1. No acute intracranial abnormality.
2. Mild chronic small vessel ischemic disease and cerebral atrophy.
3. Incidental 10 mm meningioma at the left vertex.

## 2017-06-08 DIAGNOSIS — E663 Overweight: Secondary | ICD-10-CM | POA: Diagnosis not present

## 2017-06-08 DIAGNOSIS — G894 Chronic pain syndrome: Secondary | ICD-10-CM | POA: Diagnosis not present

## 2017-06-08 DIAGNOSIS — Z1389 Encounter for screening for other disorder: Secondary | ICD-10-CM | POA: Diagnosis not present

## 2017-06-08 DIAGNOSIS — Z6827 Body mass index (BMI) 27.0-27.9, adult: Secondary | ICD-10-CM | POA: Diagnosis not present

## 2017-06-18 DIAGNOSIS — F419 Anxiety disorder, unspecified: Secondary | ICD-10-CM | POA: Diagnosis not present

## 2017-06-18 DIAGNOSIS — E559 Vitamin D deficiency, unspecified: Secondary | ICD-10-CM | POA: Diagnosis not present

## 2017-06-18 DIAGNOSIS — E663 Overweight: Secondary | ICD-10-CM | POA: Diagnosis not present

## 2017-06-18 DIAGNOSIS — M4180 Other forms of scoliosis, site unspecified: Secondary | ICD-10-CM | POA: Diagnosis not present

## 2017-06-18 DIAGNOSIS — G894 Chronic pain syndrome: Secondary | ICD-10-CM | POA: Diagnosis not present

## 2017-06-18 DIAGNOSIS — Z6829 Body mass index (BMI) 29.0-29.9, adult: Secondary | ICD-10-CM | POA: Diagnosis not present

## 2017-09-14 DIAGNOSIS — E063 Autoimmune thyroiditis: Secondary | ICD-10-CM | POA: Diagnosis not present

## 2017-09-14 DIAGNOSIS — Z6827 Body mass index (BMI) 27.0-27.9, adult: Secondary | ICD-10-CM | POA: Diagnosis not present

## 2017-09-14 DIAGNOSIS — G894 Chronic pain syndrome: Secondary | ICD-10-CM | POA: Diagnosis not present

## 2017-09-14 DIAGNOSIS — J449 Chronic obstructive pulmonary disease, unspecified: Secondary | ICD-10-CM | POA: Diagnosis not present

## 2017-09-14 DIAGNOSIS — F419 Anxiety disorder, unspecified: Secondary | ICD-10-CM | POA: Diagnosis not present

## 2017-09-14 DIAGNOSIS — F17290 Nicotine dependence, other tobacco product, uncomplicated: Secondary | ICD-10-CM | POA: Diagnosis not present

## 2017-09-14 DIAGNOSIS — I10 Essential (primary) hypertension: Secondary | ICD-10-CM | POA: Diagnosis not present

## 2017-09-14 DIAGNOSIS — H8113 Benign paroxysmal vertigo, bilateral: Secondary | ICD-10-CM | POA: Diagnosis not present

## 2017-09-14 DIAGNOSIS — Z1389 Encounter for screening for other disorder: Secondary | ICD-10-CM | POA: Diagnosis not present

## 2017-12-07 DIAGNOSIS — E063 Autoimmune thyroiditis: Secondary | ICD-10-CM | POA: Diagnosis not present

## 2017-12-07 DIAGNOSIS — J449 Chronic obstructive pulmonary disease, unspecified: Secondary | ICD-10-CM | POA: Diagnosis not present

## 2017-12-07 DIAGNOSIS — E663 Overweight: Secondary | ICD-10-CM | POA: Diagnosis not present

## 2017-12-07 DIAGNOSIS — Z1389 Encounter for screening for other disorder: Secondary | ICD-10-CM | POA: Diagnosis not present

## 2017-12-07 DIAGNOSIS — J329 Chronic sinusitis, unspecified: Secondary | ICD-10-CM | POA: Diagnosis not present

## 2017-12-07 DIAGNOSIS — Z6825 Body mass index (BMI) 25.0-25.9, adult: Secondary | ICD-10-CM | POA: Diagnosis not present

## 2017-12-07 DIAGNOSIS — I10 Essential (primary) hypertension: Secondary | ICD-10-CM | POA: Diagnosis not present

## 2017-12-07 DIAGNOSIS — G894 Chronic pain syndrome: Secondary | ICD-10-CM | POA: Diagnosis not present

## 2017-12-07 DIAGNOSIS — F419 Anxiety disorder, unspecified: Secondary | ICD-10-CM | POA: Diagnosis not present

## 2017-12-08 ENCOUNTER — Other Ambulatory Visit (HOSPITAL_COMMUNITY): Payer: Self-pay | Admitting: Internal Medicine

## 2017-12-08 DIAGNOSIS — E2839 Other primary ovarian failure: Secondary | ICD-10-CM

## 2017-12-08 DIAGNOSIS — Z1231 Encounter for screening mammogram for malignant neoplasm of breast: Secondary | ICD-10-CM

## 2018-03-01 DIAGNOSIS — Z1389 Encounter for screening for other disorder: Secondary | ICD-10-CM | POA: Diagnosis not present

## 2018-03-01 DIAGNOSIS — E063 Autoimmune thyroiditis: Secondary | ICD-10-CM | POA: Diagnosis not present

## 2018-03-01 DIAGNOSIS — G894 Chronic pain syndrome: Secondary | ICD-10-CM | POA: Diagnosis not present

## 2018-03-01 DIAGNOSIS — Z0001 Encounter for general adult medical examination with abnormal findings: Secondary | ICD-10-CM | POA: Diagnosis not present

## 2018-03-01 DIAGNOSIS — I10 Essential (primary) hypertension: Secondary | ICD-10-CM | POA: Diagnosis not present

## 2018-03-01 DIAGNOSIS — F419 Anxiety disorder, unspecified: Secondary | ICD-10-CM | POA: Diagnosis not present

## 2018-03-01 DIAGNOSIS — Z6830 Body mass index (BMI) 30.0-30.9, adult: Secondary | ICD-10-CM | POA: Diagnosis not present

## 2018-05-25 DIAGNOSIS — E063 Autoimmune thyroiditis: Secondary | ICD-10-CM | POA: Diagnosis not present

## 2018-05-25 DIAGNOSIS — I10 Essential (primary) hypertension: Secondary | ICD-10-CM | POA: Diagnosis not present

## 2018-05-25 DIAGNOSIS — G894 Chronic pain syndrome: Secondary | ICD-10-CM | POA: Diagnosis not present

## 2018-05-25 DIAGNOSIS — Z23 Encounter for immunization: Secondary | ICD-10-CM | POA: Diagnosis not present

## 2018-05-25 DIAGNOSIS — Z6828 Body mass index (BMI) 28.0-28.9, adult: Secondary | ICD-10-CM | POA: Diagnosis not present

## 2018-05-25 DIAGNOSIS — E663 Overweight: Secondary | ICD-10-CM | POA: Diagnosis not present

## 2018-05-25 DIAGNOSIS — M1991 Primary osteoarthritis, unspecified site: Secondary | ICD-10-CM | POA: Diagnosis not present

## 2018-05-25 DIAGNOSIS — M2550 Pain in unspecified joint: Secondary | ICD-10-CM | POA: Diagnosis not present

## 2018-07-26 DIAGNOSIS — E782 Mixed hyperlipidemia: Secondary | ICD-10-CM | POA: Diagnosis not present

## 2018-07-26 DIAGNOSIS — E063 Autoimmune thyroiditis: Secondary | ICD-10-CM | POA: Diagnosis not present

## 2018-07-26 DIAGNOSIS — J449 Chronic obstructive pulmonary disease, unspecified: Secondary | ICD-10-CM | POA: Diagnosis not present

## 2018-07-26 DIAGNOSIS — F419 Anxiety disorder, unspecified: Secondary | ICD-10-CM | POA: Diagnosis not present

## 2018-07-26 DIAGNOSIS — Z1389 Encounter for screening for other disorder: Secondary | ICD-10-CM | POA: Diagnosis not present

## 2018-07-26 DIAGNOSIS — Z6828 Body mass index (BMI) 28.0-28.9, adult: Secondary | ICD-10-CM | POA: Diagnosis not present

## 2018-07-26 DIAGNOSIS — G894 Chronic pain syndrome: Secondary | ICD-10-CM | POA: Diagnosis not present

## 2018-07-26 DIAGNOSIS — I10 Essential (primary) hypertension: Secondary | ICD-10-CM | POA: Diagnosis not present

## 2018-07-26 DIAGNOSIS — M2550 Pain in unspecified joint: Secondary | ICD-10-CM | POA: Diagnosis not present

## 2018-07-26 DIAGNOSIS — Z23 Encounter for immunization: Secondary | ICD-10-CM | POA: Diagnosis not present

## 2018-10-19 DIAGNOSIS — I10 Essential (primary) hypertension: Secondary | ICD-10-CM | POA: Diagnosis not present

## 2018-10-19 DIAGNOSIS — Z6828 Body mass index (BMI) 28.0-28.9, adult: Secondary | ICD-10-CM | POA: Diagnosis not present

## 2018-10-19 DIAGNOSIS — Z1389 Encounter for screening for other disorder: Secondary | ICD-10-CM | POA: Diagnosis not present

## 2018-10-19 DIAGNOSIS — E663 Overweight: Secondary | ICD-10-CM | POA: Diagnosis not present

## 2018-10-19 DIAGNOSIS — G894 Chronic pain syndrome: Secondary | ICD-10-CM | POA: Diagnosis not present

## 2018-10-19 DIAGNOSIS — M1991 Primary osteoarthritis, unspecified site: Secondary | ICD-10-CM | POA: Diagnosis not present

## 2018-12-06 DIAGNOSIS — G64 Other disorders of peripheral nervous system: Secondary | ICD-10-CM | POA: Diagnosis not present

## 2018-12-06 DIAGNOSIS — Z6828 Body mass index (BMI) 28.0-28.9, adult: Secondary | ICD-10-CM | POA: Diagnosis not present

## 2018-12-06 DIAGNOSIS — G894 Chronic pain syndrome: Secondary | ICD-10-CM | POA: Diagnosis not present

## 2018-12-06 DIAGNOSIS — M797 Fibromyalgia: Secondary | ICD-10-CM | POA: Diagnosis not present

## 2018-12-06 DIAGNOSIS — Z1389 Encounter for screening for other disorder: Secondary | ICD-10-CM | POA: Diagnosis not present

## 2019-01-02 ENCOUNTER — Emergency Department (HOSPITAL_COMMUNITY): Payer: PPO

## 2019-01-02 ENCOUNTER — Observation Stay (HOSPITAL_COMMUNITY)
Admission: EM | Admit: 2019-01-02 | Discharge: 2019-01-04 | Disposition: A | Discharge status: Home / Self Care | Payer: PPO | Attending: Family Medicine | Admitting: Family Medicine

## 2019-01-02 ENCOUNTER — Encounter (HOSPITAL_COMMUNITY): Payer: Self-pay | Admitting: Emergency Medicine

## 2019-01-02 ENCOUNTER — Other Ambulatory Visit: Payer: Self-pay

## 2019-01-02 DIAGNOSIS — S0990XA Unspecified injury of head, initial encounter: Secondary | ICD-10-CM | POA: Diagnosis not present

## 2019-01-02 DIAGNOSIS — Z79899 Other long term (current) drug therapy: Secondary | ICD-10-CM | POA: Diagnosis not present

## 2019-01-02 DIAGNOSIS — I10 Essential (primary) hypertension: Secondary | ICD-10-CM | POA: Diagnosis not present

## 2019-01-02 DIAGNOSIS — Z7982 Long term (current) use of aspirin: Secondary | ICD-10-CM | POA: Insufficient documentation

## 2019-01-02 DIAGNOSIS — E860 Dehydration: Secondary | ICD-10-CM | POA: Diagnosis not present

## 2019-01-02 DIAGNOSIS — R7989 Other specified abnormal findings of blood chemistry: Secondary | ICD-10-CM | POA: Diagnosis not present

## 2019-01-02 DIAGNOSIS — D72829 Elevated white blood cell count, unspecified: Secondary | ICD-10-CM

## 2019-01-02 DIAGNOSIS — F17200 Nicotine dependence, unspecified, uncomplicated: Secondary | ICD-10-CM | POA: Insufficient documentation

## 2019-01-02 DIAGNOSIS — S22080A Wedge compression fracture of T11-T12 vertebra, initial encounter for closed fracture: Principal | ICD-10-CM | POA: Insufficient documentation

## 2019-01-02 DIAGNOSIS — R52 Pain, unspecified: Secondary | ICD-10-CM | POA: Diagnosis not present

## 2019-01-02 DIAGNOSIS — Y999 Unspecified external cause status: Secondary | ICD-10-CM | POA: Insufficient documentation

## 2019-01-02 DIAGNOSIS — E039 Hypothyroidism, unspecified: Secondary | ICD-10-CM | POA: Diagnosis not present

## 2019-01-02 DIAGNOSIS — J449 Chronic obstructive pulmonary disease, unspecified: Secondary | ICD-10-CM | POA: Insufficient documentation

## 2019-01-02 DIAGNOSIS — R05 Cough: Secondary | ICD-10-CM | POA: Diagnosis not present

## 2019-01-02 DIAGNOSIS — R42 Dizziness and giddiness: Secondary | ICD-10-CM | POA: Diagnosis not present

## 2019-01-02 DIAGNOSIS — S3991XA Unspecified injury of abdomen, initial encounter: Secondary | ICD-10-CM | POA: Diagnosis not present

## 2019-01-02 DIAGNOSIS — W19XXXA Unspecified fall, initial encounter: Secondary | ICD-10-CM | POA: Insufficient documentation

## 2019-01-02 DIAGNOSIS — R197 Diarrhea, unspecified: Secondary | ICD-10-CM | POA: Insufficient documentation

## 2019-01-02 DIAGNOSIS — S299XXA Unspecified injury of thorax, initial encounter: Secondary | ICD-10-CM | POA: Diagnosis not present

## 2019-01-02 DIAGNOSIS — M4850XA Collapsed vertebra, not elsewhere classified, site unspecified, initial encounter for fracture: Secondary | ICD-10-CM

## 2019-01-02 DIAGNOSIS — Y92009 Unspecified place in unspecified non-institutional (private) residence as the place of occurrence of the external cause: Secondary | ICD-10-CM | POA: Insufficient documentation

## 2019-01-02 DIAGNOSIS — Y939 Activity, unspecified: Secondary | ICD-10-CM | POA: Insufficient documentation

## 2019-01-02 DIAGNOSIS — R778 Other specified abnormalities of plasma proteins: Secondary | ICD-10-CM

## 2019-01-02 HISTORY — DX: Ataxia, unspecified: R27.0

## 2019-01-02 HISTORY — DX: Repeated falls: R29.6

## 2019-01-02 HISTORY — DX: Other specified dorsopathies, cervical region: M53.82

## 2019-01-02 HISTORY — DX: Chronic obstructive pulmonary disease, unspecified: J44.9

## 2019-01-02 HISTORY — DX: Headache, unspecified: R51.9

## 2019-01-02 HISTORY — DX: Headache: R51

## 2019-01-02 HISTORY — DX: Unspecified fall, initial encounter: W19.XXXA

## 2019-01-02 HISTORY — DX: Muscle weakness (generalized): M62.81

## 2019-01-02 HISTORY — DX: Other chronic pain: G89.29

## 2019-01-02 LAB — CBC WITH DIFFERENTIAL/PLATELET
Abs Immature Granulocytes: 0.15 10*3/uL — ABNORMAL HIGH (ref 0.00–0.07)
Basophils Absolute: 0.1 10*3/uL (ref 0.0–0.1)
Basophils Relative: 0 %
Eosinophils Absolute: 0 10*3/uL (ref 0.0–0.5)
Eosinophils Relative: 0 %
HCT: 46.1 % — ABNORMAL HIGH (ref 36.0–46.0)
Hemoglobin: 14.8 g/dL (ref 12.0–15.0)
Immature Granulocytes: 1 %
Lymphocytes Relative: 6 %
Lymphs Abs: 1.6 10*3/uL (ref 0.7–4.0)
MCH: 27 pg (ref 26.0–34.0)
MCHC: 32.1 g/dL (ref 30.0–36.0)
MCV: 84.1 fL (ref 80.0–100.0)
Monocytes Absolute: 1.3 10*3/uL — ABNORMAL HIGH (ref 0.1–1.0)
Monocytes Relative: 5 %
Neutro Abs: 21.7 10*3/uL — ABNORMAL HIGH (ref 1.7–7.7)
Neutrophils Relative %: 88 %
Platelets: 395 10*3/uL (ref 150–400)
RBC: 5.48 MIL/uL — ABNORMAL HIGH (ref 3.87–5.11)
RDW: 14.2 % (ref 11.5–15.5)
WBC: 24.8 10*3/uL — ABNORMAL HIGH (ref 4.0–10.5)
nRBC: 0 % (ref 0.0–0.2)

## 2019-01-02 LAB — COMPREHENSIVE METABOLIC PANEL
ALT: 13 U/L (ref 0–44)
AST: 21 U/L (ref 15–41)
Albumin: 4 g/dL (ref 3.5–5.0)
Alkaline Phosphatase: 89 U/L (ref 38–126)
Anion gap: 12 (ref 5–15)
BUN: 24 mg/dL — ABNORMAL HIGH (ref 8–23)
CO2: 23 mmol/L (ref 22–32)
Calcium: 9.3 mg/dL (ref 8.9–10.3)
Chloride: 101 mmol/L (ref 98–111)
Creatinine, Ser: 0.89 mg/dL (ref 0.44–1.00)
GFR calc Af Amer: >60 mL/min (ref >60)
GFR calc non Af Amer: >60 mL/min (ref >60)
Glucose, Bld: 126 mg/dL — ABNORMAL HIGH (ref 70–99)
Potassium: 3.5 mmol/L (ref 3.5–5.1)
Sodium: 136 mmol/L (ref 135–145)
Total Bilirubin: 0.8 mg/dL (ref 0.3–1.2)
Total Protein: 7.8 g/dL (ref 6.5–8.1)

## 2019-01-02 LAB — POC OCCULT BLOOD, ED: Fecal Occult Bld: NEGATIVE

## 2019-01-02 LAB — URINALYSIS, ROUTINE W REFLEX MICROSCOPIC
Bilirubin Urine: NEGATIVE
Glucose, UA: 50 mg/dL — AB
Ketones, ur: 20 mg/dL — AB
Leukocytes,Ua: NEGATIVE
Nitrite: NEGATIVE
Protein, ur: 100 mg/dL — AB
Specific Gravity, Urine: 1.025 (ref 1.005–1.030)
pH: 5 (ref 5.0–8.0)

## 2019-01-02 LAB — LACTIC ACID, PLASMA
Lactic Acid, Venous: 1.4 mmol/L (ref 0.5–1.9)
Lactic Acid, Venous: 1 mmol/L (ref 0.5–1.9)

## 2019-01-02 LAB — CK: Total CK: 466 U/L — ABNORMAL HIGH (ref 38–234)

## 2019-01-02 LAB — TROPONIN I: Troponin I: 0.04 ng/mL (ref <0.03)

## 2019-01-02 MED ORDER — SODIUM CHLORIDE 0.9 % IV BOLUS
500.0000 mL | Freq: Once | INTRAVENOUS | Status: AC
  Administered 2019-01-02: 23:00:00 500 mL via INTRAVENOUS

## 2019-01-02 MED ORDER — SODIUM CHLORIDE 0.9 % IV SOLN
INTRAVENOUS | Status: DC
  Administered 2019-01-02 – 2019-01-03 (×2): via INTRAVENOUS

## 2019-01-02 MED ORDER — IOHEXOL 300 MG/ML  SOLN
100.0000 mL | Freq: Once | INTRAMUSCULAR | Status: AC | PRN
  Administered 2019-01-02: 100 mL via INTRAVENOUS

## 2019-01-02 NOTE — ED Notes (Addendum)
ED TO INPATIENT HANDOFF REPORT  ED Nurse Name and Phone #:  Lonn Georgia [phone]  S Name/Age/Gender Michelle Sanders 68 y.o. female Room/Bed: APA08/APA08  Code Status   Code Status: Not on file  Home/SNF/Other Home Patient oriented to: self, place, time and situation Is this baseline? Yes   Triage Complete: Triage complete  Chief Complaint Fall   Triage Note Pt brought in by [facility] for c/o fall since 7pm last night; pt states she hit her head when she fell; cbg 135; pt states she got dizzy and fell;    Allergies Allergies  Allergen Reactions  . Prednisone   . Zanaflex [Tizanidine Hcl]     Makes heart race    Level of Care/Admitting Diagnosis ED Disposition    ED Disposition Condition [facility] Area: [facility] [[id number]]  Level of Care: Telemetry [5]  Covid Evaluation: N/A  Diagnosis: Fall [290176]  Admitting Physician: Shela Leff [[id number]]  Attending Physician: Shela Leff [[id number]]  PT Class (Do Not Modify): Observation [104]  PT Acc Code (Do Not Modify): Observation [10022]       B Medical/Surgery History Past Medical History:  Diagnosis Date  . Arthritis   . Ataxia   . Chronic pain   . COPD (chronic obstructive pulmonary disease) ([place])   . Falls   . Fibromyalgia   . Headache   . Hypertension   . Neck muscle weakness   . Proximal muscle weakness    Past Surgical History:  Procedure Laterality Date  . [place]  . CHOLECYSTECTOMY  1982     A IV Location/Drains/Wounds Patient Lines/Drains/Airways Status   Active Line/Drains/Airways    Name:   Placement date:   Placement time:   Site:   Days:   Peripheral IV 01/02/19 Right Antecubital   01/02/19    2029    Antecubital   less than 1          Intake/Output Last 24 hours  Intake/Output Summary (Last 24 hours) at 01/02/2019 2344 Last data filed at 01/02/2019 2301 Gross per 24 hour  Intake -  Output 250 ml  Net -250 ml    Labs/Imaging Results  for orders placed or performed during the hospital encounter of 01/02/19 (from the past 48 hour(s))  Comprehensive metabolic panel     Status: Abnormal   Collection Time: 01/02/19  8:29 PM  Result Value Ref Range   Sodium 136 135 - 145 mmol/L   Potassium 3.5 3.5 - 5.1 mmol/L   Chloride 101 98 - 111 mmol/L   CO2 23 22 - 32 mmol/L   Glucose, Bld 126 (H) 70 - 99 mg/dL   BUN 24 (H) 8 - 23 mg/dL   Creatinine, Ser 0.89 0.44 - 1.00 mg/dL   Calcium 9.3 8.9 - 10.3 mg/dL   Total Protein 7.8 6.5 - 8.1 g/dL   Albumin 4.0 3.5 - 5.0 g/dL   AST 21 15 - 41 U/L   ALT 13 0 - 44 U/L   Alkaline Phosphatase 89 38 - 126 U/L   Total Bilirubin 0.8 0.3 - 1.2 mg/dL   GFR calc non Af Amer >60 >60 mL/min   GFR calc Af Amer >60 >60 mL/min   Anion gap 12 5 - 15    Comment: Performed at [facility], [address], [place], [place] [address]  Troponin I - Once     Status: Abnormal   Collection Time: 01/02/19  8:29 PM  Result Value Ref  Range   Troponin I 0.04 (HH) <0.03 ng/mL    Comment: CRITICAL RESULT CALLED TO, READ BACK BY AND VERIFIED WITH: Quadasia Newsham,K. AT 2120 ON 01/02/2019 BY EVA Performed at [facility], [address], [place], [place] [address]   CBC with Differential     Status: Abnormal   Collection Time: 01/02/19  8:29 PM  Result Value Ref Range   WBC 24.8 (H) 4.0 - 10.5 K/uL   RBC 5.48 (H) 3.87 - 5.11 MIL/uL   Hemoglobin 14.8 12.0 - 15.0 g/dL   HCT 46.1 (H) 36.0 - 46.0 %   MCV 84.1 80.0 - 100.0 fL   MCH 27.0 26.0 - 34.0 pg   MCHC 32.1 30.0 - 36.0 g/dL   RDW 14.2 11.5 - 15.5 %   Platelets 395 150 - 400 K/uL   nRBC 0.0 0.0 - 0.2 %   Neutrophils Relative % 88 %   Neutro Abs 21.7 (H) 1.7 - 7.7 K/uL   Lymphocytes Relative 6 %   Lymphs Abs 1.6 0.7 - 4.0 K/uL   Monocytes Relative 5 %   Monocytes Absolute 1.3 (H) 0.1 - 1.0 K/uL   Eosinophils Relative 0 %   Eosinophils Absolute 0.0 0.0 - 0.5 K/uL   Basophils Relative 0 %   Basophils Absolute 0.1 0.0 - 0.1 K/uL   Immature Granulocytes 1 %    Abs Immature Granulocytes 0.15 (H) 0.00 - 0.07 K/uL    Comment: Performed at [facility], [address], [place], [place] [address]  CK     Status: Abnormal   Collection Time: 01/02/19  8:29 PM  Result Value Ref Range   Total CK 466 (H) 38 - 234 U/L    Comment: Performed at [facility], [address], [place], [place] [address]  Culture, blood (routine x 2)     Status: None (Preliminary result)   Collection Time: 01/02/19  8:29 PM  Result Value Ref Range   Specimen Description RIGHT ANTECUBITAL    Special Requests      BOTTLES DRAWN AEROBIC AND ANAEROBIC Blood Culture adequate volume Performed at [facility], [address] [place], [place] [address]    Culture PENDING    Report Status PENDING   POC occult blood, ED     Status: None   Collection Time: 01/02/19  8:36 PM  Result Value Ref Range   Fecal Occult Bld NEGATIVE NEGATIVE  Lactic acid, plasma     Status: None   Collection Time: 01/02/19  8:48 PM  Result Value Ref Range   Lactic Acid, Venous 1.4 0.5 - 1.9 mmol/L    Comment: Performed at [facility], [address], [place], [place] [address]  Culture, blood (routine x 2)     Status: None (Preliminary result)   Collection Time: 01/02/19  8:48 PM  Result Value Ref Range   Specimen Description BLOOD LEFT WRIST    Special Requests      BOTTLES DRAWN AEROBIC AND ANAEROBIC Blood Culture adequate volume Performed at [facility], [address] [place], [place] [address]    Culture PENDING    Report Status PENDING   Lactic acid, plasma     Status: None   Collection Time: 01/02/19 10:37 PM  Result Value Ref Range   Lactic Acid, Venous 1.0 0.5 - 1.9 mmol/L    Comment: Performed at [facility], [address] [place],  [address]  Urinalysis, Routine w reflex microscopic     Status: Abnormal   Collection Time: 01/02/19 11:01  PM  Result Value Ref Range   Color, Urine YELLOW YELLOW   APPearance HAZY (A) CLEAR   Specific Gravity, Urine 1.025 1.005 - 1.030   pH 5.0  5.0 - 8.0   Glucose, UA 50 (A) NEGATIVE mg/dL   Hgb urine dipstick MODERATE (A) NEGATIVE   Bilirubin Urine NEGATIVE NEGATIVE   Ketones, ur 20 (A) NEGATIVE mg/dL   Protein, ur 100 (A) NEGATIVE mg/dL   Nitrite NEGATIVE NEGATIVE   Leukocytes,Ua NEGATIVE NEGATIVE   RBC / HPF 0-5 0 - 5 RBC/hpf   WBC, UA 0-5 0 - 5 WBC/hpf   Bacteria, UA RARE (A) NONE SEEN   Squamous Epithelial / LPF 0-5 0 - 5   Mucus PRESENT     Comment: Performed at [facility], [address] [place], [place] [address]   Dg Chest 2 View  Result Date: 01/02/2019 CLINICAL DATA:  Fall EXAM: CHEST - 2 VIEW COMPARISON:  02/28/2013 FINDINGS: Heart is upper limits normal in size. Mild peribronchial thickening and interstitial prominence, favor bronchitic changes. No confluent opacities, effusions or pneumothorax. No acute bony abnormality. IMPRESSION: Bronchitic changes. Electronically Signed   By: Rolm Baptise M.D.   On: 01/02/2019 22:00   Ct Head Wo Contrast  Result Date: 01/02/2019 CLINICAL DATA:  Fall, hit head EXAM: CT HEAD WITHOUT CONTRAST CT CERVICAL SPINE WITHOUT CONTRAST TECHNIQUE: Multidetector CT imaging of the head and cervical spine was performed following the standard protocol without intravenous contrast. Multiplanar CT image reconstructions of the cervical spine were also generated. COMPARISON:  MRI 10/16/2015 FINDINGS: CT HEAD FINDINGS Brain: There is atrophy and chronic small vessel disease changes. No acute intracranial abnormality. Specifically, no hemorrhage, hydrocephalus, mass lesion, acute infarction, or significant intracranial injury. Vascular: No hyperdense vessel or unexpected calcification. Skull: No acute calvarial abnormality. Sinuses/Orbits: Visualized paranasal sinuses and mastoids clear. Orbital soft tissues unremarkable. Other: Small left frontal meningioma again noted, stable, 8 mm. CT CERVICAL SPINE FINDINGS Alignment: Slight anterolisthesis of C4 on C5 related to facet disease. Skull base and  vertebrae: No acute fracture. No primary bone lesion or focal pathologic process. Soft tissues and spinal canal: No prevertebral fluid or swelling. No visible canal hematoma. Disc levels: Diffuse disc space narrowing and spurring. Diffuse degenerative facet disease bilaterally. Upper chest: No acute findings.  Mild emphysema. Other: None IMPRESSION: Atrophy, chronic microvascular disease. No acute intracranial abnormality. Degenerative disc and facet disease in the cervical spine. No acute bony abnormality. Electronically Signed   By: Rolm Baptise M.D.   On: 01/02/2019 22:41   Ct Cervical Spine Wo Contrast  Result Date: 01/02/2019 CLINICAL DATA:  Fall, hit head EXAM: CT HEAD WITHOUT CONTRAST CT CERVICAL SPINE WITHOUT CONTRAST TECHNIQUE: Multidetector CT imaging of the head and cervical spine was performed following the standard protocol without intravenous contrast. Multiplanar CT image reconstructions of the cervical spine were also generated. COMPARISON:  MRI 10/16/2015 FINDINGS: CT HEAD FINDINGS Brain: There is atrophy and chronic small vessel disease changes. No acute intracranial abnormality. Specifically, no hemorrhage, hydrocephalus, mass lesion, acute infarction, or significant intracranial injury. Vascular: No hyperdense vessel or unexpected calcification. Skull: No acute calvarial abnormality. Sinuses/Orbits: Visualized paranasal sinuses and mastoids clear. Orbital soft tissues unremarkable. Other: Small left frontal meningioma again noted, stable, 8 mm. CT CERVICAL SPINE FINDINGS Alignment: Slight anterolisthesis of C4 on C5 related to facet disease. Skull base and vertebrae: No acute fracture. No primary bone lesion or focal pathologic process. Soft tissues and spinal canal: No prevertebral fluid or swelling. No visible canal  hematoma. Disc levels: Diffuse disc space narrowing and spurring. Diffuse degenerative facet disease bilaterally. Upper chest: No acute findings.  Mild emphysema. Other: None  IMPRESSION: Atrophy, chronic microvascular disease. No acute intracranial abnormality. Degenerative disc and facet disease in the cervical spine. No acute bony abnormality. Electronically Signed   By: Rolm Baptise M.D.   On: 01/02/2019 22:41   Ct Abdomen Pelvis W Contrast  Result Date: 01/02/2019 CLINICAL DATA:  Status post fall. EXAM: CT ABDOMEN AND PELVIS WITH CONTRAST TECHNIQUE: Multidetector CT imaging of the abdomen and pelvis was performed using the standard protocol following bolus administration of intravenous contrast. CONTRAST:  136mL OMNIPAQUE IOHEXOL 300 MG/ML  SOLN COMPARISON:  None. FINDINGS: Lower chest: Unremarkable. Hepatobiliary: No suspicious focal abnormality within the liver parenchyma. Gallbladder surgically absent. Extrahepatic common duct measures up to 13 mm diameter and gradually tapers into the ampulla. Pancreas: There is prominence of the main pancreatic duct in the head of the pancreas with no ductal dilatation in the pancreatic tail. Scattered parenchymal calcification in the pancreas raises the question of chronic pancreatitis and a 12 mm cyst is identified in the pancreatic body, best visualized on coronal image 36 of series 6. Spleen: No splenomegaly. No focal mass lesion. Adrenals/Urinary Tract: No adrenal nodule or mass. Right kidney unremarkable. Tiny low-density lesions in the left kidney are too small to characterize but likely benign. No evidence for hydroureter. Bladder is markedly distended. Stomach/Bowel: Small hiatal hernia. Stomach otherwise unremarkable. Duodenum is normally positioned as is the ligament of Treitz. No small bowel wall thickening. No small bowel dilatation. The terminal ileum is normal. Normal appendix best appreciated on coronal imaging.: Unremarkable with short segment transverse colon extending out through a midline ventral hernia. No complicating features. Diverticular changes are noted in the left colon without evidence of diverticulitis.  Vascular/Lymphatic: There is abdominal aortic atherosclerosis without aneurysm. There is no gastrohepatic or hepatoduodenal ligament lymphadenopathy. No intraperitoneal or retroperitoneal lymphadenopathy. No pelvic sidewall lymphadenopathy. Reproductive: Unremarkable. Other: No intraperitoneal free fluid. Musculoskeletal: No worrisome lytic or sclerotic osseous abnormality. Mild compression deformity noted T12 superior endplate. IMPRESSION: 1. No acute findings in the abdomen or pelvis. 2. Midline ventral hernia contains short segment of transverse colon without complicating features. 3. Mild extrahepatic biliary duct dilatation in this patient status post cholecystectomy. Correlation with liver function tests suggested. 4. 12 mm cystic lesion pancreatic body. Given lesion size and patient age, follow-up CT abdomen with contrast in 2 years recommended to re-evaluate. This recommendation follows ACR consensus guidelines: Management of Incidental Pancreatic Cysts: A White Paper of the ACR Incidental Findings Committee. J Am Coll Radiol 7341;93:790-240. 5.  Aortic Atherosclerois (ICD10-170.0) 6. Colonic diverticulosis without diverticulitis. 7. Mild T12 compression deformity. Please see lumbar spine CT reported separately. Electronically Signed   By: Misty Stanley M.D.   On: 01/02/2019 22:45   Ct L-spine No Charge  Result Date: 01/02/2019 CLINICAL DATA:  Fall EXAM: CT LUMBAR SPINE WITHOUT CONTRAST TECHNIQUE: Multidetector CT imaging of the lumbar spine was performed without intravenous contrast administration. Multiplanar CT image reconstructions were also generated. COMPARISON:  None. FINDINGS: Segmentation: 5 lumbar type vertebrae. Alignment: Normal. Vertebrae: There is an acute wedge compression fracture of T12 with approximately 10-20% height loss. No retropulsion. The fracture involves the superior endplate and anterior wall. No other fracture. Paraspinal and other soft tissues: Please see dedicated report for  CT abdomen pelvis. Disc levels: There is no large disc herniation or bony spinal canal stenosis. There is moderate right and mild left foraminal  narrowing at L5-S1 due to disc space narrowing and endplate spurring. IMPRESSION: 1. Acute wedge compression fracture of T12 with approximately 10-20% height loss, involving the superior endplate and anterior wall. No retropulsion. 2. Moderate right and mild left foraminal narrowing at L5-S1 due to disc space narrowing and endplate spurring. Electronically Signed   By: Ulyses Jarred M.D.   On: 01/02/2019 22:40    Pending Labs Unresulted Labs (From admission, onward)    Start     Ordered   01/02/19 2004  Urine culture  ONCE - STAT,   STAT     01/02/19 2003          Vitals/Pain Today's Vitals   01/02/19 2030 01/02/19 2100 01/02/19 2315 01/02/19 2330  BP: (!) 163/79 (!) 145/84  (!) 182/90  Pulse: 81  76   Resp:      Temp:      TempSrc:      SpO2: 100%  95%   Weight:      Height:      PainSc:        Isolation Precautions No active isolations  Medications Medications  0.9 %  sodium chloride infusion ( Intravenous New Bag/Given 01/02/19 2243)  sodium chloride 0.9 % bolus 500 mL (500 mLs Intravenous New Bag/Given 01/02/19 2243)  iohexol (OMNIPAQUE) 300 MG/ML solution 100 mL (100 mLs Intravenous Contrast Given 01/02/19 2209)    Mobility non-ambulatory High fall risk   Focused Assessments Neuro Assessment Handoff:      R Recommendations: See Admitting Provider Note  Report given to:  alisha Additional Notes:  18 g R ac. Stage 1 ulcers on knees and elbows. Knees have abrasions

## 2019-01-02 NOTE — H&P (Addendum)
History and Physical    Michelle Sanders ZOX:[id number] DOB: October 21, 1950 DOA: 01/02/2019  PCP: [employer] [employer], MD Patient coming from: Home  Chief Complaint: Fall  HPI: Michelle Sanders is a 68 y.o. female with medical history significant of arthritis, ataxia, chronic pain, COPD, falls, fibromyalgia, hypertension presenting to the hospital for evaluation of a fall.  Patient states she was walking to the bathroom at home, lost her balance and fell.  She hit her head.  She was not using a walker to ambulate.  Does report feeling lightheaded prior to the fall.  Denies any chest pain or shortness of breath at that time.  States she did not lose consciousness.  Reports having severe lower back pain since the fall.  States she has not been eating much for the past 2 days.  Denies any nausea, vomiting, or abdominal pain.  Does report diarrhea, unclear when this started.  Denies recent antibiotic use.  Denies any fevers, chills, cough, sick contacts, or exposure to any individual with confirmed COVID-19.  Denies any dysuria, urinary frequency, or urgency.  Review of Systems: As per HPI otherwise 10 point review of systems negative.  Past Medical History:  Diagnosis Date   Arthritis    Ataxia    Chronic pain    COPD (chronic obstructive pulmonary disease) (HCC)    Falls    Fibromyalgia    Headache    Hypertension    Neck muscle weakness    Proximal muscle weakness     Past Surgical History:  Procedure Laterality Date   [place]     reports that she has been smoking. She has never used smokeless tobacco. She reports that she does not drink alcohol or use drugs.  Allergies  Allergen Reactions   Prednisone    Zanaflex [Tizanidine Hcl]     Makes heart race    Family History  Problem Relation Age of Onset   Stroke Sister    Arthritis Other    Neuropathy Neg Hx     Prior to Admission medications   Medication Sig Start Date End  Date Taking? Authorizing Provider  albuterol (PROVENTIL HFA;VENTOLIN HFA) 108 (90 Base) MCG/ACT inhaler Inhale 2 puffs into the lungs every 6 (six) hours as needed for wheezing or shortness of breath.    [provider]  ALPRAZolam Duanne Moron) 1 MG tablet Take 1 mg by mouth 4 (four) times daily.    [provider]  aspirin 81 MG tablet Take 81 mg by mouth daily.    [provider]  budesonide-formoterol (SYMBICORT) 160-4.5 MCG/ACT inhaler Inhale 2 puffs into the lungs 2 (two) times daily. Reported on 10/02/2015    [provider]  cyclobenzaprine (FLEXERIL) 10 MG tablet Take 10 mg by mouth 3 (three) times daily as needed for muscle spasms.    [provider]  esomeprazole (NEXIUM) 40 MG capsule Take 40 mg by mouth daily at 12 noon.    [provider]  furosemide (LASIX) 20 MG tablet Take 20 mg by mouth daily as needed.    [provider]  HYDROmorphone (DILAUDID) 8 MG tablet Take 4 mg by mouth every 4 (four) hours as needed for severe pain.     [provider]  levothyroxine (SYNTHROID, LEVOTHROID) 100 MCG tablet Take 100 mcg by mouth daily before breakfast.    [provider]  lisinopril (PRINIVIL,ZESTRIL) 30 MG tablet Take 30 mg by mouth daily.    [provider]    Physical Exam: Vitals:   01/02/19 2100 01/02/19 2315 01/02/19 2330 01/02/19 2345  BP: (!) 145/84  (!) 182/90   Pulse:  76    Resp:    14  Temp:      TempSrc:      SpO2:  95%    Weight:      Height:        Physical Exam  Constitutional: She is oriented to person, place, and time. She appears distressed.  Distressed secondary to back pain  HENT:  Head: Normocephalic.  Dry mucous membranes  Eyes: Pupils are equal, round, and reactive to light. EOM are normal.  Neck: Neck supple.  Cardiovascular: Normal rate, regular rhythm and intact distal pulses.  Pulmonary/Chest: Effort normal and breath sounds normal. No respiratory distress. She has  no wheezes. She has no rales.  Abdominal: Soft. Bowel sounds are normal. She exhibits no distension. There is no abdominal tenderness. There is no rebound and no guarding.  Musculoskeletal:        General: No edema.  Neurological: She is alert and oriented to person, place, and time. No cranial nerve deficit.  Strength 5 out of 5 in bilateral upper and lower extremities.  Sensation to light touch intact throughout.  Skin: Skin is warm and dry.     Labs on Admission: I have personally reviewed following labs and imaging studies  CBC: Recent Labs  Lab 01/02/19 2029  WBC 24.8*  NEUTROABS 21.7*  HGB 14.8  HCT 46.1*  MCV 84.1  PLT 244   Basic Metabolic Panel: Recent Labs  Lab 01/02/19 2029  NA 136  K 3.5  CL 101  CO2 23  GLUCOSE 126*  BUN 24*  CREATININE 0.89  CALCIUM 9.3   GFR: Estimated Creatinine Clearance: 50.1 mL/min (by C-G formula based on SCr of 0.89 mg/dL). Liver Function Tests: Recent Labs  Lab 01/02/19 2029  AST 21  ALT 13  ALKPHOS 89  BILITOT 0.8  PROT 7.8  ALBUMIN 4.0   No results for input(s): LIPASE, AMYLASE in the last 168 hours. No results for input(s): AMMONIA in the last 168 hours. Coagulation Profile: No results for input(s): INR, PROTIME in the last 168 hours. Cardiac Enzymes: Recent Labs  Lab 01/02/19 2029  CKTOTAL 466*  TROPONINI 0.04*   BNP (last 3 results) No results for input(s): PROBNP in the last 8760 hours. HbA1C: No results for input(s): HGBA1C in the last 72 hours. CBG: No results for input(s): GLUCAP in the last 168 hours. Lipid Profile: No results for input(s): CHOL, HDL, LDLCALC, TRIG, CHOLHDL, LDLDIRECT in the last 72 hours. Thyroid Function Tests: No results for input(s): TSH, T4TOTAL, FREET4, T3FREE, THYROIDAB in the last 72 hours. Anemia Panel: No results for input(s): VITAMINB12, FOLATE, FERRITIN, TIBC, IRON, RETICCTPCT in the last 72 hours. Urine analysis:    Component Value Date/Time   COLORURINE YELLOW  01/02/2019 2301   APPEARANCEUR HAZY (A) 01/02/2019 2301   LABSPEC 1.025 01/02/2019 2301   PHURINE 5.0 01/02/2019 2301   GLUCOSEU 50 (A) 01/02/2019 2301   HGBUR MODERATE (A) 01/02/2019 2301   BILIRUBINUR NEGATIVE 01/02/2019 2301   KETONESUR 20 (A) 01/02/2019 2301   PROTEINUR 100 (A) 01/02/2019 2301   NITRITE NEGATIVE 01/02/2019 2301   LEUKOCYTESUR NEGATIVE 01/02/2019 2301    Radiological Exams on Admission: Dg Chest 2 View  Result Date: 01/02/2019 CLINICAL DATA:  Fall EXAM: CHEST - 2 VIEW COMPARISON:  02/28/2013 FINDINGS: Heart is upper limits normal in size. Mild  peribronchial thickening and interstitial prominence, favor bronchitic changes. No confluent opacities, effusions or pneumothorax. No acute bony abnormality. IMPRESSION: Bronchitic changes. Electronically Signed   By: Rolm Baptise M.D.   On: 01/02/2019 22:00   Ct Head Wo Contrast  Result Date: 01/02/2019 CLINICAL DATA:  Fall, hit head EXAM: CT HEAD WITHOUT CONTRAST CT CERVICAL SPINE WITHOUT CONTRAST TECHNIQUE: Multidetector CT imaging of the head and cervical spine was performed following the standard protocol without intravenous contrast. Multiplanar CT image reconstructions of the cervical spine were also generated. COMPARISON:  MRI 10/16/2015 FINDINGS: CT HEAD FINDINGS Brain: There is atrophy and chronic small vessel disease changes. No acute intracranial abnormality. Specifically, no hemorrhage, hydrocephalus, mass lesion, acute infarction, or significant intracranial injury. Vascular: No hyperdense vessel or unexpected calcification. Skull: No acute calvarial abnormality. Sinuses/Orbits: Visualized paranasal sinuses and mastoids clear. Orbital soft tissues unremarkable. Other: Small left frontal meningioma again noted, stable, 8 mm. CT CERVICAL SPINE FINDINGS Alignment: Slight anterolisthesis of C4 on C5 related to facet disease. Skull base and vertebrae: No acute fracture. No primary bone lesion or focal pathologic process. Soft  tissues and spinal canal: No prevertebral fluid or swelling. No visible canal hematoma. Disc levels: Diffuse disc space narrowing and spurring. Diffuse degenerative facet disease bilaterally. Upper chest: No acute findings.  Mild emphysema. Other: None IMPRESSION: Atrophy, chronic microvascular disease. No acute intracranial abnormality. Degenerative disc and facet disease in the cervical spine. No acute bony abnormality. Electronically Signed   By: Rolm Baptise M.D.   On: 01/02/2019 22:41   Ct Cervical Spine Wo Contrast  Result Date: 01/02/2019 CLINICAL DATA:  Fall, hit head EXAM: CT HEAD WITHOUT CONTRAST CT CERVICAL SPINE WITHOUT CONTRAST TECHNIQUE: Multidetector CT imaging of the head and cervical spine was performed following the standard protocol without intravenous contrast. Multiplanar CT image reconstructions of the cervical spine were also generated. COMPARISON:  MRI 10/16/2015 FINDINGS: CT HEAD FINDINGS Brain: There is atrophy and chronic small vessel disease changes. No acute intracranial abnormality. Specifically, no hemorrhage, hydrocephalus, mass lesion, acute infarction, or significant intracranial injury. Vascular: No hyperdense vessel or unexpected calcification. Skull: No acute calvarial abnormality. Sinuses/Orbits: Visualized paranasal sinuses and mastoids clear. Orbital soft tissues unremarkable. Other: Small left frontal meningioma again noted, stable, 8 mm. CT CERVICAL SPINE FINDINGS Alignment: Slight anterolisthesis of C4 on C5 related to facet disease. Skull base and vertebrae: No acute fracture. No primary bone lesion or focal pathologic process. Soft tissues and spinal canal: No prevertebral fluid or swelling. No visible canal hematoma. Disc levels: Diffuse disc space narrowing and spurring. Diffuse degenerative facet disease bilaterally. Upper chest: No acute findings.  Mild emphysema. Other: None IMPRESSION: Atrophy, chronic microvascular disease. No acute intracranial abnormality.  Degenerative disc and facet disease in the cervical spine. No acute bony abnormality. Electronically Signed   By: Rolm Baptise M.D.   On: 01/02/2019 22:41   Ct Abdomen Pelvis W Contrast  Result Date: 01/02/2019 CLINICAL DATA:  Status post fall. EXAM: CT ABDOMEN AND PELVIS WITH CONTRAST TECHNIQUE: Multidetector CT imaging of the abdomen and pelvis was performed using the standard protocol following bolus administration of intravenous contrast. CONTRAST:  133mL OMNIPAQUE IOHEXOL 300 MG/ML  SOLN COMPARISON:  None. FINDINGS: Lower chest: Unremarkable. Hepatobiliary: No suspicious focal abnormality within the liver parenchyma. Gallbladder surgically absent. Extrahepatic common duct measures up to 13 mm diameter and gradually tapers into the ampulla. Pancreas: There is prominence of the main pancreatic duct in the head of the pancreas with no ductal dilatation in the pancreatic  tail. Scattered parenchymal calcification in the pancreas raises the question of chronic pancreatitis and a 12 mm cyst is identified in the pancreatic body, best visualized on coronal image 36 of series 6. Spleen: No splenomegaly. No focal mass lesion. Adrenals/Urinary Tract: No adrenal nodule or mass. Right kidney unremarkable. Tiny low-density lesions in the left kidney are too small to characterize but likely benign. No evidence for hydroureter. Bladder is markedly distended. Stomach/Bowel: Small hiatal hernia. Stomach otherwise unremarkable. Duodenum is normally positioned as is the ligament of Treitz. No small bowel wall thickening. No small bowel dilatation. The terminal ileum is normal. Normal appendix best appreciated on coronal imaging.: Unremarkable with short segment transverse colon extending out through a midline ventral hernia. No complicating features. Diverticular changes are noted in the left colon without evidence of diverticulitis. Vascular/Lymphatic: There is abdominal aortic atherosclerosis without aneurysm. There is no  gastrohepatic or hepatoduodenal ligament lymphadenopathy. No intraperitoneal or retroperitoneal lymphadenopathy. No pelvic sidewall lymphadenopathy. Reproductive: Unremarkable. Other: No intraperitoneal free fluid. Musculoskeletal: No worrisome lytic or sclerotic osseous abnormality. Mild compression deformity noted T12 superior endplate. IMPRESSION: 1. No acute findings in the abdomen or pelvis. 2. Midline ventral hernia contains short segment of transverse colon without complicating features. 3. Mild extrahepatic biliary duct dilatation in this patient status post cholecystectomy. Correlation with liver function tests suggested. 4. 12 mm cystic lesion pancreatic body. Given lesion size and patient age, follow-up CT abdomen with contrast in 2 years recommended to re-evaluate. This recommendation follows ACR consensus guidelines: Management of Incidental Pancreatic Cysts: A White Paper of the ACR Incidental Findings Committee. J Am Coll Radiol 9983;38:250-539. 5.  Aortic Atherosclerois (ICD10-170.0) 6. Colonic diverticulosis without diverticulitis. 7. Mild T12 compression deformity. Please see lumbar spine CT reported separately. Electronically Signed   By: Misty Stanley M.D.   On: 01/02/2019 22:45   Ct L-spine No Charge  Result Date: 01/02/2019 CLINICAL DATA:  Fall EXAM: CT LUMBAR SPINE WITHOUT CONTRAST TECHNIQUE: Multidetector CT imaging of the lumbar spine was performed without intravenous contrast administration. Multiplanar CT image reconstructions were also generated. COMPARISON:  None. FINDINGS: Segmentation: 5 lumbar type vertebrae. Alignment: Normal. Vertebrae: There is an acute wedge compression fracture of T12 with approximately 10-20% height loss. No retropulsion. The fracture involves the superior endplate and anterior wall. No other fracture. Paraspinal and other soft tissues: Please see dedicated report for CT abdomen pelvis. Disc levels: There is no large disc herniation or bony spinal canal  stenosis. There is moderate right and mild left foraminal narrowing at L5-S1 due to disc space narrowing and endplate spurring. IMPRESSION: 1. Acute wedge compression fracture of T12 with approximately 10-20% height loss, involving the superior endplate and anterior wall. No retropulsion. 2. Moderate right and mild left foraminal narrowing at L5-S1 due to disc space narrowing and endplate spurring. Electronically Signed   By: Ulyses Jarred M.D.   On: 01/02/2019 22:40    EKG: Independently reviewed.  Sinus rhythm, baseline wander, QTC 504.  No prior EKG for comparison.  Assessment/Plan Principal Problem:   Fall Active Problems:   Leukocytosis   Vertebral compression fracture (HCC)   Diarrhea   Elevated troponin   Fall Patient denies loss of consciousness.  However, does report feeling lightheaded prior to the fall.  Afebrile.  Concern for possible seizure as CK mildly elevated at 466 and has leukocytosis with no infectious etiology identified so far.  Not hypoglycemic on arrival.  FOBT negative.  CT head negative for acute finding.  CT C-spine negative for acute  bony abnormality.  CT lumbar spine with evidence of acute wedge compression fracture of T12. -IV fluid hydration -Check orthostatics -PT and OT evaluation -Continue to trend CK -EEG -Management of leukocytosis and vertebral compression fracture as mentioned below.  Leukocytosis, unclear etiology White count 98.9 with neutrophilic predominance.  Afebrile.  Lactic acid normal.  Chest x-ray not suggestive of pneumonia.  UA not suggestive of infection.  CT abdomen and pelvis negative for infectious source.  No signs of cellulitis.  No obvious wounds/ulcers noted on exam. -Broad-spectrum antibiotics including vancomycin and Zosyn at this time -Repeat CBC with differential in a.m. -Urine culture -Blood culture x2  Vertebral compression fracture CT lumbar spine showing acute wedge compression fracture of T12 with approximately 10 to  20% height loss involving the superior endplate and anterior wall.  No retropulsion.  No neuro deficit or focal weakness. -Pain management: Morphine 1 mg every 3 hours as needed, Norco 5-325 mg 1 to 2 tablets every 6 hours as needed, Tylenol PRN -PT and OT evaluation -Outpatient follow-up  Diarrhea Patient reports having diarrhea, unclear when this started.  Abdominal exam benign.  CT abdomen and pelvis negative for acute finding. -IV fluid hydration -GI pathogen panel -Loperamide PRN  Borderline troponin elevation Troponin 0.04.  EKG not suggestive of ACS.  Patient is not complaining of chest pain. -Cardiac monitoring -Continue to trend troponin  Pancreatic cyst CT showing 12 mm cystic lesion of the pancreatic body.  -Patient will need follow-up CT abdomen with contrast on an outpatient basis for continued monitoring.  DVT prophylaxis: Lovenox Code Status: Full code Family Communication: No family available. Disposition Plan: Anticipate discharge after clinical improvement. Consults called: None Admission status: Observation, telemetry  This chart was dictated using voice recognition software.  Despite best efforts to proofread, errors can occur which can change the documentation meaning.  Shela Leff MD [facility] Pager [phone]  If 7PM-7AM, please contact night-coverage [email or web address] Password [facility]  01/03/2019, 12:46 AM

## 2019-01-02 NOTE — ED Triage Notes (Signed)
Pt brought in by [facility] for c/o fall since 7pm last night; pt states she hit her head when she fell; cbg 135; pt states she got dizzy and fell;

## 2019-01-02 NOTE — ED Provider Notes (Signed)
[facility] EMERGENCY DEPARTMENT Provider Note   CSN: [id number] Arrival date & time: 01/02/19  1946    History   Chief Complaint Chief Complaint  Patient presents with   Fall    HPI Michelle Sanders is a 68 y.o. female.      Fall     Pt was seen at [facility]. Per EMS and pt report, c/o sudden onset and resolution of one episode of fall that occurred today. Pt states she "felt dizzy" before she fell. Pt describes her dizziness as "lightheadedness." States she fell onto her buttocks and then back. Pt states she did hit her head. Pt also endorses several episodes of diarrhea today. States she "felt fine yesterday." Denies new focal motor weakness, no tingling/numbness in extremities, no CP/palpitations, no SOB/cough, non abd pain, no N/V, no black or blood in stools, no fevers, no rash, no syncope, no vertigo.     Past Medical History:  Diagnosis Date   Arthritis    Ataxia    Chronic pain    COPD (chronic obstructive pulmonary disease) (HCC)    Falls    Fibromyalgia    Headache    Hypertension    Neck muscle weakness    Proximal muscle weakness     Patient Active Problem List   Diagnosis Date Noted   Ataxia 10/02/2015   Falls 10/02/2015   Numbness 10/02/2015   Proximal muscle weakness 10/02/2015   Neck muscle weakness 10/02/2015   Acute confusional state 10/02/2015   Fibromyalgia 10/02/2015   Chronic pain syndrome 10/02/2015   Generalized anxiety disorder 10/02/2015   Depression 10/02/2015   Hypothyroidism 10/02/2015   COPD (chronic obstructive pulmonary disease) ([place]) 10/02/2015   SHOULDER PAIN 10/02/2008   IMPINGEMENT SYNDROME 10/02/2008    Past Surgical History:  Procedure Laterality Date   [place]     OB History   No obstetric history on file.      Home Medications    Prior to Admission medications   Medication Sig Start Date End Date Taking? Authorizing Provider  albuterol  (PROVENTIL HFA;VENTOLIN HFA) 108 (90 Base) MCG/ACT inhaler Inhale 2 puffs into the lungs every 6 (six) hours as needed for wheezing or shortness of breath.    [provider]  ALPRAZolam Duanne Moron) 1 MG tablet Take 1 mg by mouth 4 (four) times daily.    [provider]  aspirin 81 MG tablet Take 81 mg by mouth daily.    [provider]  budesonide-formoterol (SYMBICORT) 160-4.5 MCG/ACT inhaler Inhale 2 puffs into the lungs 2 (two) times daily. Reported on 10/02/2015    [provider]  cyclobenzaprine (FLEXERIL) 10 MG tablet Take 10 mg by mouth 3 (three) times daily as needed for muscle spasms.    [provider]  esomeprazole (NEXIUM) 40 MG capsule Take 40 mg by mouth daily at 12 noon.    [provider]  furosemide (LASIX) 20 MG tablet Take 20 mg by mouth daily as needed.    [provider]  HYDROmorphone (DILAUDID) 8 MG tablet Take 4 mg by mouth every 4 (four) hours as needed for severe pain.     [provider]  levothyroxine (SYNTHROID, LEVOTHROID) 100 MCG tablet Take 100 mcg by mouth daily before breakfast.    [provider]  lisinopril (PRINIVIL,ZESTRIL) 30 MG tablet Take 30 mg by mouth daily.    [provider]    Family History Family History  Problem Relation Age  of Onset   Stroke Sister    Arthritis Other    Neuropathy Neg Hx     Social History Social History   Tobacco Use   Smoking status: Current Some Day Smoker   Smokeless tobacco: Never Used  Substance Use Topics   Alcohol use: No    Alcohol/week: 0.0 standard drinks   Drug use: No     Allergies   Prednisone and Zanaflex [tizanidine hcl]   Review of Systems Review of Systems ROS: Statement: All systems negative except as marked or noted in the HPI; Constitutional: Negative for fever and chills. ; ; Eyes: Negative for eye pain, redness and discharge. ; ; ENMT: Negative for ear pain, hoarseness, nasal congestion, sinus  pressure and sore throat. ; ; Cardiovascular: Negative for chest pain, palpitations, diaphoresis, dyspnea and peripheral edema. ; ; Respiratory: Negative for cough, wheezing and stridor. ; ; Gastrointestinal: +diarrhea. Negative for nausea, vomiting, abdominal pain, blood in stool, hematemesis, jaundice and rectal bleeding. . ; ; Genitourinary: Negative for dysuria, flank pain and hematuria. ; ; Musculoskeletal: +head injury, back pain. Negative for neck pain. Negative for swelling and deformity.; ; Skin: Negative for pruritus, rash, abrasions, blisters, bruising and skin lesion.; ; Neuro: +lightheadedness. Negative for headache and neck stiffness. Negative for weakness, altered level of consciousness, altered mental status, extremity weakness, paresthesias, involuntary movement, seizure and syncope.       Physical Exam Updated Vital Signs BP (!) 145/84    Pulse 81    Temp 99.7 F (37.6 C) (Rectal)    Resp 17    Ht 5' (1.524 m)    Wt 63 kg    SpO2 100%    BMI 27.15 kg/m    Patient Vitals for the past 24 hrs:  BP Temp Temp src Pulse Resp SpO2 Height Weight  01/02/19 2100 (!) 145/84 -- -- -- -- -- -- --  01/02/19 2030 (!) 163/79 -- -- 81 -- 100 % -- --  01/02/19 2015 -- -- -- 72 -- 99 % -- --  01/02/19 2007 -- 99.7 F (37.6 C) Rectal -- -- -- -- --  01/02/19 2000 (!) 161/101 -- -- -- 17 -- -- --  01/02/19 1955 (!) 176/107 -- Oral 78 (!) 21 98 % -- --  01/02/19 1952 -- -- -- -- -- -- 5' (1.524 m) 63 kg    20:07 Orthostatic Vital Signs LN  Orthostatic Lying   BP- Lying: 129/70  Pulse- Lying: 84      Orthostatic Sitting  BP- Sitting: 162/67  Pulse- Sitting: 75     Physical Exam 2000: Physical examination:  Nursing notes reviewed; Vital signs and O2 SAT reviewed;  Constitutional: Well developed, Well nourished, In no acute distress; Head:  Normocephalic, atraumatic; Eyes: EOMI, PERRL, No scleral icterus; ENMT: Mouth and pharynx normal, Mucous membranes dry; Neck: Supple, Full range of  motion, No lymphadenopathy; Cardiovascular: Regular rate and rhythm, No gallop; Respiratory: Breath sounds clear & equal bilaterally, No rales, rhonchi, wheezes.  Speaking full sentences with ease, Normal respiratory effort/excursion; Chest: Nontender, Movement normal; Abdomen: Soft, Nontender, Nondistended, Normal bowel sounds. +NT reducible ventral hernia. Pt soiled with feces..; Genitourinary: No CVA tenderness; Spine:  No midline CS, TS, LS tenderness. +mild TTP right thoracic and lumbar paraspinal muscles. No abrasions or ecchymosis.;;  Extremities: Peripheral pulses normal, Pelvis stable. +multiple scattered ecchymosis in various stages of healing to bilat LE's. No deformity, no tenderness, No edema, No calf edema or asymmetry.; Neuro: AA&Ox3, Major CN grossly intact. No  facial droop. Speech clear. Grips equal. Moves all extremities on stretcher and to command without apparent gross focal motor deficits in extremities.; Skin: Color normal, Warm, Dry.    ED Treatments / Results  Labs (all labs ordered are listed, but only abnormal results are displayed)   EKG EKG Interpretation  Date/Time:  Sunday January 02 2019 19:55:08 EDT Ventricular Rate:  76 PR Interval:    QRS Duration: 95 QT Interval:  448 QTC Calculation: 504 R Axis:   80 Text Interpretation:  Sinus rhythm Consider left atrial enlargement Prolonged QT interval Baseline wander Artifact No old tracing to compare Confirmed by Francine Graven [phone]) on 01/02/2019 9:36:34 PM   Radiology   Procedures Procedures (including critical care time)  Medications Ordered in ED Medications - No data to display   Initial Impression / Assessment and Plan / ED Course  I have reviewed the triage vital signs and the nursing notes.  Pertinent labs & imaging results that were available during my care of the patient were reviewed by me and considered in my medical decision making (see chart for details).     MDM Reviewed: previous  chart, nursing note and vitals Reviewed previous: labs and ECG Interpretation: labs, ECG, x-ray and CT scan   Results for orders placed or performed during the hospital encounter of 01/02/19  Culture, blood (routine x 2)  Result Value Ref Range   Specimen Description RIGHT ANTECUBITAL    Special Requests      BOTTLES DRAWN AEROBIC AND ANAEROBIC Blood Culture adequate volume Performed at [facility], [address] [place], [place] [address]    Culture PENDING    Report Status PENDING   Culture, blood (routine x 2)  Result Value Ref Range   Specimen Description BLOOD LEFT WRIST    Special Requests      BOTTLES DRAWN AEROBIC AND ANAEROBIC Blood Culture adequate volume Performed at [facility], [address] [place], [place] [address]    Culture PENDING    Report Status PENDING   Comprehensive metabolic panel  Result Value Ref Range   Sodium 136 135 - 145 mmol/L   Potassium 3.5 3.5 - 5.1 mmol/L   Chloride 101 98 - 111 mmol/L   CO2 23 22 - 32 mmol/L   Glucose, Bld 126 (H) 70 - 99 mg/dL   BUN 24 (H) 8 - 23 mg/dL   Creatinine, Ser 0.89 0.44 - 1.00 mg/dL   Calcium 9.3 8.9 - 10.3 mg/dL   Total Protein 7.8 6.5 - 8.1 g/dL   Albumin 4.0 3.5 - 5.0 g/dL   AST 21 15 - 41 U/L   ALT 13 0 - 44 U/L   Alkaline Phosphatase 89 38 - 126 U/L   Total Bilirubin 0.8 0.3 - 1.2 mg/dL   GFR calc non Af Amer >60 >60 mL/min   GFR calc Af Amer >60 >60 mL/min   Anion gap 12 5 - 15  Troponin I - Once  Result Value Ref Range   Troponin I 0.04 (HH) <0.03 ng/mL  Lactic acid, plasma  Result Value Ref Range   Lactic Acid, Venous 1.4 0.5 - 1.9 mmol/L  CBC with Differential  Result Value Ref Range   WBC 24.8 (H) 4.0 - 10.5 K/uL   RBC 5.48 (H) 3.87 - 5.11 MIL/uL   Hemoglobin 14.8 12.0 - 15.0 g/dL   HCT 46.1 (H) 36.0 - 46.0 %   MCV 84.1 80.0 - 100.0 fL   MCH 27.0 26.0 - 34.0 pg  MCHC 32.1 30.0 - 36.0 g/dL   RDW 14.2 11.5 - 15.5 %   Platelets 395 150 - 400 K/uL   nRBC 0.0 0.0 - 0.2 %    Neutrophils Relative % 88 %   Neutro Abs 21.7 (H) 1.7 - 7.7 K/uL   Lymphocytes Relative 6 %   Lymphs Abs 1.6 0.7 - 4.0 K/uL   Monocytes Relative 5 %   Monocytes Absolute 1.3 (H) 0.1 - 1.0 K/uL   Eosinophils Relative 0 %   Eosinophils Absolute 0.0 0.0 - 0.5 K/uL   Basophils Relative 0 %   Basophils Absolute 0.1 0.0 - 0.1 K/uL   Immature Granulocytes 1 %   Abs Immature Granulocytes 0.15 (H) 0.00 - 0.07 K/uL  CK  Result Value Ref Range   Total CK 466 (H) 38 - 234 U/L  POC occult blood, ED  Result Value Ref Range   Fecal Occult Bld NEGATIVE NEGATIVE    Dg Chest 2 View Result Date: 01/02/2019 CLINICAL DATA:  Fall EXAM: CHEST - 2 VIEW COMPARISON:  02/28/2013 FINDINGS: Heart is upper limits normal in size. Mild peribronchial thickening and interstitial prominence, favor bronchitic changes. No confluent opacities, effusions or pneumothorax. No acute bony abnormality. IMPRESSION: Bronchitic changes. Electronically Signed   By: Rolm Baptise M.D.   On: 01/02/2019 22:00   Ct Head Wo Contrast Result Date: 01/02/2019 CLINICAL DATA:  Fall, hit head EXAM: CT HEAD WITHOUT CONTRAST CT CERVICAL SPINE WITHOUT CONTRAST TECHNIQUE: Multidetector CT imaging of the head and cervical spine was performed following the standard protocol without intravenous contrast. Multiplanar CT image reconstructions of the cervical spine were also generated. COMPARISON:  MRI 10/16/2015 FINDINGS: CT HEAD FINDINGS Brain: There is atrophy and chronic small vessel disease changes. No acute intracranial abnormality. Specifically, no hemorrhage, hydrocephalus, mass lesion, acute infarction, or significant intracranial injury. Vascular: No hyperdense vessel or unexpected calcification. Skull: No acute calvarial abnormality. Sinuses/Orbits: Visualized paranasal sinuses and mastoids clear. Orbital soft tissues unremarkable. Other: Small left frontal meningioma again noted, stable, 8 mm. CT CERVICAL SPINE FINDINGS Alignment: Slight  anterolisthesis of C4 on C5 related to facet disease. Skull base and vertebrae: No acute fracture. No primary bone lesion or focal pathologic process. Soft tissues and spinal canal: No prevertebral fluid or swelling. No visible canal hematoma. Disc levels: Diffuse disc space narrowing and spurring. Diffuse degenerative facet disease bilaterally. Upper chest: No acute findings.  Mild emphysema. Other: None IMPRESSION: Atrophy, chronic microvascular disease. No acute intracranial abnormality. Degenerative disc and facet disease in the cervical spine. No acute bony abnormality. Electronically Signed   By: Rolm Baptise M.D.   On: 01/02/2019 22:41   Ct Cervical Spine Wo Contrast Result Date: 01/02/2019 CLINICAL DATA:  Fall, hit head EXAM: CT HEAD WITHOUT CONTRAST CT CERVICAL SPINE WITHOUT CONTRAST TECHNIQUE: Multidetector CT imaging of the head and cervical spine was performed following the standard protocol without intravenous contrast. Multiplanar CT image reconstructions of the cervical spine were also generated. COMPARISON:  MRI 10/16/2015 FINDINGS: CT HEAD FINDINGS Brain: There is atrophy and chronic small vessel disease changes. No acute intracranial abnormality. Specifically, no hemorrhage, hydrocephalus, mass lesion, acute infarction, or significant intracranial injury. Vascular: No hyperdense vessel or unexpected calcification. Skull: No acute calvarial abnormality. Sinuses/Orbits: Visualized paranasal sinuses and mastoids clear. Orbital soft tissues unremarkable. Other: Small left frontal meningioma again noted, stable, 8 mm. CT CERVICAL SPINE FINDINGS Alignment: Slight anterolisthesis of C4 on C5 related to facet disease. Skull base and vertebrae: No acute fracture. No primary bone  lesion or focal pathologic process. Soft tissues and spinal canal: No prevertebral fluid or swelling. No visible canal hematoma. Disc levels: Diffuse disc space narrowing and spurring. Diffuse degenerative facet disease  bilaterally. Upper chest: No acute findings.  Mild emphysema. Other: None IMPRESSION: Atrophy, chronic microvascular disease. No acute intracranial abnormality. Degenerative disc and facet disease in the cervical spine. No acute bony abnormality. Electronically Signed   By: Rolm Baptise M.D.   On: 01/02/2019 22:41   Ct Abdomen Pelvis W Contrast Result Date: 01/02/2019 CLINICAL DATA:  Status post fall. EXAM: CT ABDOMEN AND PELVIS WITH CONTRAST TECHNIQUE: Multidetector CT imaging of the abdomen and pelvis was performed using the standard protocol following bolus administration of intravenous contrast. CONTRAST:  126mL OMNIPAQUE IOHEXOL 300 MG/ML  SOLN COMPARISON:  None. FINDINGS: Lower chest: Unremarkable. Hepatobiliary: No suspicious focal abnormality within the liver parenchyma. Gallbladder surgically absent. Extrahepatic common duct measures up to 13 mm diameter and gradually tapers into the ampulla. Pancreas: There is prominence of the main pancreatic duct in the head of the pancreas with no ductal dilatation in the pancreatic tail. Scattered parenchymal calcification in the pancreas raises the question of chronic pancreatitis and a 12 mm cyst is identified in the pancreatic body, best visualized on coronal image 36 of series 6. Spleen: No splenomegaly. No focal mass lesion. Adrenals/Urinary Tract: No adrenal nodule or mass. Right kidney unremarkable. Tiny low-density lesions in the left kidney are too small to characterize but likely benign. No evidence for hydroureter. Bladder is markedly distended. Stomach/Bowel: Small hiatal hernia. Stomach otherwise unremarkable. Duodenum is normally positioned as is the ligament of Treitz. No small bowel wall thickening. No small bowel dilatation. The terminal ileum is normal. Normal appendix best appreciated on coronal imaging.: Unremarkable with short segment transverse colon extending out through a midline ventral hernia. No complicating features. Diverticular changes  are noted in the left colon without evidence of diverticulitis. Vascular/Lymphatic: There is abdominal aortic atherosclerosis without aneurysm. There is no gastrohepatic or hepatoduodenal ligament lymphadenopathy. No intraperitoneal or retroperitoneal lymphadenopathy. No pelvic sidewall lymphadenopathy. Reproductive: Unremarkable. Other: No intraperitoneal free fluid. Musculoskeletal: No worrisome lytic or sclerotic osseous abnormality. Mild compression deformity noted T12 superior endplate. IMPRESSION: 1. No acute findings in the abdomen or pelvis. 2. Midline ventral hernia contains short segment of transverse colon without complicating features. 3. Mild extrahepatic biliary duct dilatation in this patient status post cholecystectomy. Correlation with liver function tests suggested. 4. 12 mm cystic lesion pancreatic body. Given lesion size and patient age, follow-up CT abdomen with contrast in 2 years recommended to re-evaluate. This recommendation follows ACR consensus guidelines: Management of Incidental Pancreatic Cysts: A White Paper of the ACR Incidental Findings Committee. J Am Coll Radiol 1914;78:295-621. 5.  Aortic Atherosclerois (ICD10-170.0) 6. Colonic diverticulosis without diverticulitis. 7. Mild T12 compression deformity. Please see lumbar spine CT reported separately. Electronically Signed   By: Misty Stanley M.D.   On: 01/02/2019 22:45   Ct L-spine No Charge Result Date: 01/02/2019 CLINICAL DATA:  Fall EXAM: CT LUMBAR SPINE WITHOUT CONTRAST TECHNIQUE: Multidetector CT imaging of the lumbar spine was performed without intravenous contrast administration. Multiplanar CT image reconstructions were also generated. COMPARISON:  None. FINDINGS: Segmentation: 5 lumbar type vertebrae. Alignment: Normal. Vertebrae: There is an acute wedge compression fracture of T12 with approximately 10-20% height loss. No retropulsion. The fracture involves the superior endplate and anterior wall. No other fracture.  Paraspinal and other soft tissues: Please see dedicated report for CT abdomen pelvis. Disc levels: There is no  large disc herniation or bony spinal canal stenosis. There is moderate right and mild left foraminal narrowing at L5-S1 due to disc space narrowing and endplate spurring. IMPRESSION: 1. Acute wedge compression fracture of T12 with approximately 10-20% height loss, involving the superior endplate and anterior wall. No retropulsion. 2. Moderate right and mild left foraminal narrowing at L5-S1 due to disc space narrowing and endplate spurring. Electronically Signed   By: Ulyses Jarred M.D.   On: 01/02/2019 22:40    Michelle Sanders was evaluated in Emergency Department on 01/02/2019 for the symptoms described in the history of present illness. She was evaluated in the context of the global COVID-19 pandemic, which necessitated consideration that the patient might be at risk for infection with the SARS-CoV-2 virus that causes COVID-19. Institutional protocols and algorithms that pertain to the evaluation of patients at risk for COVID-19 are in a state of rapid change based on information released by regulatory bodies including the CDC and federal and state organizations. These policies and algorithms were followed during the patient's care in the ED.     2305:   No urine in bladder, and appears clinically dehydrated; judicious IVF bolus/gtt given with small amount of urine output. Pt is unable to stand for orthostatic VS due to weakness. Pt covered with feces on arrival; stool is heme negative. WBC count elevated, but no fever while in ED and workup (pending UA/UC) is otherwise reassuring. BC have been obtained. Question WBC demargination d/t stress.  Will observation admit for rehydration and pain conteol. T/C returned from [facility] Dr. Marlowe Sax, case discussed, including:  HPI, pertinent PM/SHx, VS/PE, dx testing, ED course and treatment:  Agreeable to admit.        Final Clinical Impressions(s) / ED  Diagnoses   Final diagnoses:  None    ED Discharge Orders    None       Francine Graven, DO 01/05/19 1621

## 2019-01-02 NOTE — ED Notes (Signed)
Pt unable to stand. Unable to obtain orthostatic v/s at this time

## 2019-01-02 NOTE — ED Notes (Addendum)
Amsi Grimley- [phone]- son- call for any updates.

## 2019-01-02 NOTE — ED Notes (Signed)
I&O cath failed. Bladder empty of urine

## 2019-01-02 NOTE — ED Notes (Signed)
Date and time results received: 01/02/19 9:20 PM  (use smartphrase ".now" to insert current time)  Test: trop  Critical Value: 0.04 Name of Provider Notified: [email or web address]   Orders Received? Or Actions Taken?: none at this time

## 2019-01-03 ENCOUNTER — Other Ambulatory Visit: Payer: Self-pay

## 2019-01-03 ENCOUNTER — Observation Stay (HOSPITAL_COMMUNITY)
Admit: 2019-01-03 | Discharge: 2019-01-03 | Disposition: A | Discharge status: Home / Self Care | Payer: PPO | Attending: Internal Medicine | Admitting: Internal Medicine

## 2019-01-03 DIAGNOSIS — W19XXXD Unspecified fall, subsequent encounter: Secondary | ICD-10-CM | POA: Diagnosis not present

## 2019-01-03 DIAGNOSIS — R7989 Other specified abnormal findings of blood chemistry: Secondary | ICD-10-CM | POA: Diagnosis not present

## 2019-01-03 DIAGNOSIS — S22080D Wedge compression fracture of T11-T12 vertebra, subsequent encounter for fracture with routine healing: Secondary | ICD-10-CM | POA: Diagnosis not present

## 2019-01-03 DIAGNOSIS — Y92009 Unspecified place in unspecified non-institutional (private) residence as the place of occurrence of the external cause: Secondary | ICD-10-CM

## 2019-01-03 DIAGNOSIS — R197 Diarrhea, unspecified: Secondary | ICD-10-CM

## 2019-01-03 DIAGNOSIS — R778 Other specified abnormalities of plasma proteins: Secondary | ICD-10-CM

## 2019-01-03 DIAGNOSIS — M4850XA Collapsed vertebra, not elsewhere classified, site unspecified, initial encounter for fracture: Secondary | ICD-10-CM

## 2019-01-03 DIAGNOSIS — D72829 Elevated white blood cell count, unspecified: Secondary | ICD-10-CM

## 2019-01-03 DIAGNOSIS — W19XXXA Unspecified fall, initial encounter: Secondary | ICD-10-CM | POA: Diagnosis not present

## 2019-01-03 LAB — CBC WITH DIFFERENTIAL/PLATELET
Abs Immature Granulocytes: 0.12 10*3/uL — ABNORMAL HIGH (ref 0.00–0.07)
Basophils Absolute: 0 10*3/uL (ref 0.0–0.1)
Basophils Relative: 0 %
Eosinophils Absolute: 0 10*3/uL (ref 0.0–0.5)
Eosinophils Relative: 0 %
HCT: 40.4 % (ref 36.0–46.0)
Hemoglobin: 13 g/dL (ref 12.0–15.0)
Immature Granulocytes: 1 %
Lymphocytes Relative: 10 %
Lymphs Abs: 2 10*3/uL (ref 0.7–4.0)
MCH: 27.1 pg (ref 26.0–34.0)
MCHC: 32.2 g/dL (ref 30.0–36.0)
MCV: 84.2 fL (ref 80.0–100.0)
Monocytes Absolute: 1.4 10*3/uL — ABNORMAL HIGH (ref 0.1–1.0)
Monocytes Relative: 7 %
Neutro Abs: 16.6 10*3/uL — ABNORMAL HIGH (ref 1.7–7.7)
Neutrophils Relative %: 82 %
Platelets: 346 10*3/uL (ref 150–400)
RBC: 4.8 MIL/uL (ref 3.87–5.11)
RDW: 14.4 % (ref 11.5–15.5)
WBC: 20.2 10*3/uL — ABNORMAL HIGH (ref 4.0–10.5)
nRBC: 0 % (ref 0.0–0.2)

## 2019-01-03 LAB — TROPONIN I
Troponin I: 0.04 ng/mL (ref <0.03)
Troponin I: 0.04 ng/mL (ref <0.03)

## 2019-01-03 LAB — CK: Total CK: 502 U/L — ABNORMAL HIGH (ref 38–234)

## 2019-01-03 MED ORDER — ALBUTEROL SULFATE (2.5 MG/3ML) 0.083% IN NEBU: 3.0000 mL | INHALATION_SOLUTION | Freq: Four times a day (QID) | RESPIRATORY_TRACT | Status: DC | PRN

## 2019-01-03 MED ORDER — MOMETASONE FURO-FORMOTEROL FUM 200-5 MCG/ACT IN AERO
2.0000 | INHALATION_SPRAY | Freq: Two times a day (BID) | RESPIRATORY_TRACT | Status: DC
  Administered 2019-01-03 – 2019-01-04 (×3): 2 via RESPIRATORY_TRACT
  Filled 2019-01-03: qty 8.8

## 2019-01-03 MED ORDER — PIPERACILLIN-TAZOBACTAM 3.375 G IVPB
3.3750 g | Freq: Three times a day (TID) | INTRAVENOUS | Status: DC
  Administered 2019-01-03 (×2): 3.375 g via INTRAVENOUS
  Filled 2019-01-03 (×2): qty 50

## 2019-01-03 MED ORDER — HYDRALAZINE HCL 20 MG/ML IJ SOLN: 5.0000 mg | INTRAMUSCULAR | Status: DC | PRN

## 2019-01-03 MED ORDER — LISINOPRIL 10 MG PO TABS
30.0000 mg | ORAL_TABLET | Freq: Every day | ORAL | Status: DC
  Administered 2019-01-03 – 2019-01-04 (×2): 30 mg via ORAL
  Filled 2019-01-03 (×2): qty 3

## 2019-01-03 MED ORDER — VANCOMYCIN HCL 10 G IV SOLR
1250.0000 mg | INTRAVENOUS | Status: DC
  Administered 2019-01-03: 03:00:00 1250 mg via INTRAVENOUS
  Filled 2019-01-03 (×2): qty 1250

## 2019-01-03 MED ORDER — ADULT MULTIVITAMIN W/MINERALS CH
1.0000 | ORAL_TABLET | Freq: Every day | ORAL | Status: DC
  Administered 2019-01-03 – 2019-01-04 (×2): 1 via ORAL
  Filled 2019-01-03 (×2): qty 1

## 2019-01-03 MED ORDER — ACETAMINOPHEN 325 MG PO TABS
650.0000 mg | ORAL_TABLET | Freq: Four times a day (QID) | ORAL | Status: DC | PRN
  Administered 2019-01-04: 650 mg via ORAL
  Filled 2019-01-03: qty 2

## 2019-01-03 MED ORDER — ASPIRIN EC 81 MG PO TBEC
81.0000 mg | DELAYED_RELEASE_TABLET | Freq: Every day | ORAL | Status: DC
  Administered 2019-01-03 – 2019-01-04 (×2): 81 mg via ORAL
  Filled 2019-01-03 (×2): qty 1

## 2019-01-03 MED ORDER — LEVOTHYROXINE SODIUM 100 MCG PO TABS
100.0000 ug | ORAL_TABLET | Freq: Every day | ORAL | Status: DC
  Administered 2019-01-04: 07:00:00 100 ug via ORAL
  Filled 2019-01-03: qty 1

## 2019-01-03 MED ORDER — MORPHINE SULFATE (PF) 2 MG/ML IV SOLN
1.0000 mg | INTRAVENOUS | Status: DC | PRN
  Administered 2019-01-03 (×2): 1 mg via INTRAVENOUS
  Filled 2019-01-03 (×2): qty 1

## 2019-01-03 MED ORDER — SODIUM CHLORIDE 0.9 % IV BOLUS
500.0000 mL | Freq: Once | INTRAVENOUS | Status: AC
  Administered 2019-01-03: 02:00:00 500 mL via INTRAVENOUS

## 2019-01-03 MED ORDER — HYDROCODONE-ACETAMINOPHEN 5-325 MG PO TABS
1.0000 | ORAL_TABLET | Freq: Four times a day (QID) | ORAL | Status: DC | PRN
  Administered 2019-01-03 – 2019-01-04 (×4): 1 via ORAL
  Filled 2019-01-03: qty 2
  Filled 2019-01-03 (×3): qty 1

## 2019-01-03 MED ORDER — ALPRAZOLAM 0.25 MG PO TABS
0.2500 mg | ORAL_TABLET | Freq: Four times a day (QID) | ORAL | Status: DC | PRN
  Administered 2019-01-03: 23:00:00 0.25 mg via ORAL
  Filled 2019-01-03: qty 1

## 2019-01-03 MED ORDER — ACETAMINOPHEN 650 MG RE SUPP: 650.0000 mg | Freq: Four times a day (QID) | RECTAL | Status: DC | PRN

## 2019-01-03 MED ORDER — ENSURE ENLIVE PO LIQD
237.0000 mL | Freq: Two times a day (BID) | ORAL | Status: DC
  Administered 2019-01-03 – 2019-01-04 (×3): 237 mL via ORAL

## 2019-01-03 MED ORDER — PANTOPRAZOLE SODIUM 40 MG PO TBEC
40.0000 mg | DELAYED_RELEASE_TABLET | Freq: Every day | ORAL | Status: DC
  Administered 2019-01-03 – 2019-01-04 (×2): 40 mg via ORAL
  Filled 2019-01-03 (×2): qty 1

## 2019-01-03 MED ORDER — LOPERAMIDE HCL 2 MG PO CAPS: 2.0000 mg | ORAL_CAPSULE | ORAL | Status: DC | PRN

## 2019-01-03 MED ORDER — ENOXAPARIN SODIUM 40 MG/0.4ML ~~LOC~~ SOLN
40.0000 mg | SUBCUTANEOUS | Status: DC
  Administered 2019-01-03 – 2019-01-04 (×2): 40 mg via SUBCUTANEOUS
  Filled 2019-01-03 (×2): qty 0.4

## 2019-01-03 NOTE — Progress Notes (Signed)
Initial Nutrition Assessment  DOCUMENTATION CODES:      INTERVENTION:  Ensure Enlive po BID, each supplement provides 350 kcal and 20 grams of protein   Recommend liberalize diet if medically feasible due to reported poor oral intake   Provide multivitamin daily due to suspected chronic inadequacy of intake  NUTRITION DIAGNOSIS:   Inadequate oral intake related to decreased appetite as evidenced by per patient/family report(history of weight loss and poor intake PTA- time frame is unclear ).   GOAL:   Patient will meet greater than or equal to 90% of their needs  MONITOR:   PO intake, Supplement acceptance, Weight trends, Labs  REASON FOR ASSESSMENT:   Malnutrition Screening Tool    ASSESSMENT: Patient is a 68 yo female with history of HTN, COPD, Falls, ataxia, arthritis and chronic pain. Patient presents to ED following a fall at home. Patient appears older than actual age.    RD found no family at bedside. Patient is pleasant and conversant but complaining of some pain and is calling nurse to put back in bed.   CT lumbar- acute compression fx at T12.  Chest x-ray- no acute findings and UA is negative CT abdomen- pancreatic cyst- not impacting current acute care plan  Patient lives alone and her son lives in [place] and has groceries delivered to her house. She endorses a poor appetite and little intake 1-2 days prior to admission. Talked with her about increased risk for dehydration in older adults and encouraged her to consider adding Carnation Instant breakfast or Oral nutrition supplement to her daily intake when she is not eating at baseline.  Patient is unable to provide usual weight. Her chart history is limited only 3 values from 2016- u[ to this admission: ranging 63.5 kg to current 53.8 kg.   Medications reviewed and include: Ensure Enlive BID, Synthroid, Protonix, Imodium.   Labs:  BMP Latest Ref Rng & Units 01/02/2019 10/02/2015  Glucose 70 - 99 mg/dL 126(H)  80  BUN 8 - 23 mg/dL 24(H) 15  Creatinine 0.44 - 1.00 mg/dL 0.89 0.69  BUN/Creat Ratio 11 - 26 - 22  Sodium 135 - 145 mmol/L 136 139  Potassium 3.5 - 5.1 mmol/L 3.5 4.9  Chloride 98 - 111 mmol/L 101 96  CO2 22 - 32 mmol/L 23 29  Calcium 8.9 - 10.3 mg/dL 9.3 9.1     NUTRITION - FOCUSED PHYSICAL EXAM:  Unable to complete Nutrition-Focused physical exam at this time.    Diet Order:   Diet Order            Diet Heart Room service appropriate? Yes; Fluid consistency: Thin  Diet effective now              EDUCATION NEEDS:   Education needs have been addressed   Skin:  Skin Assessment: Reviewed RN Assessment(dry and flaky)  Last BM:  4/20 diarrhea reported by RN  Height:   Ht Readings from Last 1 Encounters:  01/03/19 5\' 1"  (1.549 m)    Weight:   Wt Readings from Last 1 Encounters:  01/03/19 53.8 kg    Ideal Body Weight:  48 kg  BMI:  Body mass index is 22.41 kg/m.  Estimated Nutritional Needs:   Kcal:  1350-1620 (25-30 kcal/kg/bw)  Protein:  65-76 gr (1.2-1.4 gr/kg/bw)  Fluid:  >1400 ml daily   Colman Cater MS,RD,CSG,LDN Office: [phone] Pager: [phone]

## 2019-01-03 NOTE — Progress Notes (Signed)
PROGRESS NOTE    Michelle Sanders  OZD:[id number]  DOB: October 26, 1950  DOA: 01/02/2019 PCP: Redmond School, MD   Brief Admission Hx: 68 year old female presented by EMS after having a fall at home.  She has COPD, fibromyalgia hypertension, chronic pain and osteoarthritis.  She did not lose consciousness.  MDM/Assessment & Plan:   1. Fall at home-patient apparently never lost consciousness.  She has a mildly elevated CK which is being treated with IV fluids.  CT head negative for acute findings.  CT C-spine negative for acute bony abnormalities.  CT of the lumbar spine shows evidence of an acute wedge compression fracture at T12.  She will be continued with supportive therapy.  PT/OT evaluation pending.  EEG is being obtained to evaluate for possible seizure activity however I suspect this is secondary to her chronic pain medications. 2. Leukocytosis-etiology is unclear-likely secondary to fall and compression fracture.  No evidence of infection found.  Chest x-ray does not suggest pneumonia.  UA does not indicate infection.  CT of the abdomen pelvis negative for source of infection and no cellulitis seen on skin examination.  She was initially started on broad-spectrum antibiotic therapy however given no findings of a source of infection will go ahead and discontinue and follow clinically.  Urine cultures and blood cultures pending. 3. Hypertension - resume home meds.  4. Hypothyroidism - resume home levothyroxine.  5. GAD - Pt apparently is taking alprazolam 1 mg 4 times daily.  She is at high risk for withdrawal.  Will reduce dose but restart as needed for symptoms.  6. Acute vertebral compression fracture at T12-she is being treated supportively.  PT/OT consulted. 7. Diarrhea-patient reported diarrhea on admission however she has not exhibited diarrhea since arriving on the floors.  Follow clinically. 8. Pancreatic cyst- CT abdomen showing 12 mm cystic lesion on the pancreatic body which will  need to be followed up with a CT of the abdomen with contrast on outpatient basis in a couple of years according to radiology recommendations.  DVT prophylaxis: Lovenox Code Status: Full Family Communication: Patient at bedside Disposition Plan: Pending PT recommendations, currently observation status   Consultants:  PT/OT  Procedures:  N/A  Antimicrobials:  Zosyn 4/19 >  Vancomycin 4/19 >  Subjective: Pt says she is in a lot of pain but is able to work with PT this morning.  No chest pain.  No SOB.  No cough.   Objective: Vitals:   01/02/19 2330 01/02/19 2345 01/03/19 0137 01/03/19 0707  BP: (!) 182/90  (!) 166/88 (!) 150/67  Pulse:   91 70  Resp:  14 19 16   Temp:   98.1 F (36.7 C) 98.8 F (37.1 C)  TempSrc:   Oral Oral  SpO2:   100% 97%  Weight:   53.8 kg   Height:   5\' 1"  (1.549 m)     Intake/Output Summary (Last 24 hours) at 01/03/2019 1019 Last data filed at 01/03/2019 0600 Gross per 24 hour  Intake 1077.13 ml  Output 250 ml  Net 827.13 ml   Filed Weights   01/02/19 1952 01/03/19 0137  Weight: 63 kg 53.8 kg     REVIEW OF SYSTEMS  As per history otherwise all reviewed and reported negative  Exam:  General exam: Emaciated appearing elderly female appears much older than stated age with a pronounced thoracic kyphosis. Respiratory system: Clear. No increased work of breathing. Cardiovascular system: S1 & S2 heard. No JVD, murmurs, gallops, clicks or pedal edema. Gastrointestinal  system: Abdomen is nondistended, soft and nontender. Normal bowel sounds heard. Central nervous system: Alert and oriented. No focal neurological deficits. Extremities: no CCE.  Data Reviewed: Basic Metabolic Panel: Recent Labs  Lab 01/02/19 2029  NA 136  K 3.5  CL 101  CO2 23  GLUCOSE 126*  BUN 24*  CREATININE 0.89  CALCIUM 9.3   Liver Function Tests: Recent Labs  Lab 01/02/19 2029  AST 21  ALT 13  ALKPHOS 89  BILITOT 0.8  PROT 7.8  ALBUMIN 4.0   No  results for input(s): LIPASE, AMYLASE in the last 168 hours. No results for input(s): AMMONIA in the last 168 hours. CBC: Recent Labs  Lab 01/02/19 2029 01/03/19 0607  WBC 24.8* 20.2*  NEUTROABS 21.7* 16.6*  HGB 14.8 13.0  HCT 46.1* 40.4  MCV 84.1 84.2  PLT 395 346   Cardiac Enzymes: Recent Labs  Lab 01/02/19 2029 01/03/19 0029 01/03/19 0607  CKTOTAL 466*  --  502*  TROPONINI 0.04* 0.04* 0.04*   CBG (last 3)  No results for input(s): GLUCAP in the last 72 hours. Recent Results (from the past 240 hour(s))  Culture, blood (routine x 2)     Status: None (Preliminary result)   Collection Time: 01/02/19  8:29 PM  Result Value Ref Range Status   Specimen Description RIGHT ANTECUBITAL  Final   Special Requests   Final    BOTTLES DRAWN AEROBIC AND ANAEROBIC Blood Culture adequate volume   Culture   Final    NO GROWTH < 12 HOURS Performed at [facility], [address] [place], [place] [address]    Report Status PENDING  Incomplete  Culture, blood (routine x 2)     Status: None (Preliminary result)   Collection Time: 01/02/19  8:48 PM  Result Value Ref Range Status   Specimen Description BLOOD LEFT WRIST  Final   Special Requests   Final    BOTTLES DRAWN AEROBIC AND ANAEROBIC Blood Culture adequate volume   Culture   Final    NO GROWTH < 12 HOURS Performed at [facility], [address] [place],  [address]    Report Status PENDING  Incomplete     Studies: Dg Chest 2 View  Result Date: 01/02/2019 CLINICAL DATA:  Fall EXAM: CHEST - 2 VIEW COMPARISON:  02/28/2013 FINDINGS: Heart is upper limits normal in size. Mild peribronchial thickening and interstitial prominence, favor bronchitic changes. No confluent opacities, effusions or pneumothorax. No acute bony abnormality. IMPRESSION: Bronchitic changes. Electronically Signed   By: Rolm Baptise M.D.   On: 01/02/2019 22:00   Ct Head Wo Contrast  Result Date: 01/02/2019 CLINICAL DATA:  Fall, hit head EXAM: CT HEAD  WITHOUT CONTRAST CT CERVICAL SPINE WITHOUT CONTRAST TECHNIQUE: Multidetector CT imaging of the head and cervical spine was performed following the standard protocol without intravenous contrast. Multiplanar CT image reconstructions of the cervical spine were also generated. COMPARISON:  MRI 10/16/2015 FINDINGS: CT HEAD FINDINGS Brain: There is atrophy and chronic small vessel disease changes. No acute intracranial abnormality. Specifically, no hemorrhage, hydrocephalus, mass lesion, acute infarction, or significant intracranial injury. Vascular: No hyperdense vessel or unexpected calcification. Skull: No acute calvarial abnormality. Sinuses/Orbits: Visualized paranasal sinuses and mastoids clear. Orbital soft tissues unremarkable. Other: Small left frontal meningioma again noted, stable, 8 mm. CT CERVICAL SPINE FINDINGS Alignment: Slight anterolisthesis of C4 on C5 related to facet disease. Skull base and vertebrae: No acute fracture. No primary bone lesion or focal pathologic process. Soft tissues and spinal canal:  No prevertebral fluid or swelling. No visible canal hematoma. Disc levels: Diffuse disc space narrowing and spurring. Diffuse degenerative facet disease bilaterally. Upper chest: No acute findings.  Mild emphysema. Other: None IMPRESSION: Atrophy, chronic microvascular disease. No acute intracranial abnormality. Degenerative disc and facet disease in the cervical spine. No acute bony abnormality. Electronically Signed   By: Rolm Baptise M.D.   On: 01/02/2019 22:41   Ct Cervical Spine Wo Contrast  Result Date: 01/02/2019 CLINICAL DATA:  Fall, hit head EXAM: CT HEAD WITHOUT CONTRAST CT CERVICAL SPINE WITHOUT CONTRAST TECHNIQUE: Multidetector CT imaging of the head and cervical spine was performed following the standard protocol without intravenous contrast. Multiplanar CT image reconstructions of the cervical spine were also generated. COMPARISON:  MRI 10/16/2015 FINDINGS: CT HEAD FINDINGS Brain: There  is atrophy and chronic small vessel disease changes. No acute intracranial abnormality. Specifically, no hemorrhage, hydrocephalus, mass lesion, acute infarction, or significant intracranial injury. Vascular: No hyperdense vessel or unexpected calcification. Skull: No acute calvarial abnormality. Sinuses/Orbits: Visualized paranasal sinuses and mastoids clear. Orbital soft tissues unremarkable. Other: Small left frontal meningioma again noted, stable, 8 mm. CT CERVICAL SPINE FINDINGS Alignment: Slight anterolisthesis of C4 on C5 related to facet disease. Skull base and vertebrae: No acute fracture. No primary bone lesion or focal pathologic process. Soft tissues and spinal canal: No prevertebral fluid or swelling. No visible canal hematoma. Disc levels: Diffuse disc space narrowing and spurring. Diffuse degenerative facet disease bilaterally. Upper chest: No acute findings.  Mild emphysema. Other: None IMPRESSION: Atrophy, chronic microvascular disease. No acute intracranial abnormality. Degenerative disc and facet disease in the cervical spine. No acute bony abnormality. Electronically Signed   By: Rolm Baptise M.D.   On: 01/02/2019 22:41   Ct Abdomen Pelvis W Contrast  Result Date: 01/02/2019 CLINICAL DATA:  Status post fall. EXAM: CT ABDOMEN AND PELVIS WITH CONTRAST TECHNIQUE: Multidetector CT imaging of the abdomen and pelvis was performed using the standard protocol following bolus administration of intravenous contrast. CONTRAST:  143mL OMNIPAQUE IOHEXOL 300 MG/ML  SOLN COMPARISON:  None. FINDINGS: Lower chest: Unremarkable. Hepatobiliary: No suspicious focal abnormality within the liver parenchyma. Gallbladder surgically absent. Extrahepatic common duct measures up to 13 mm diameter and gradually tapers into the ampulla. Pancreas: There is prominence of the main pancreatic duct in the head of the pancreas with no ductal dilatation in the pancreatic tail. Scattered parenchymal calcification in the pancreas  raises the question of chronic pancreatitis and a 12 mm cyst is identified in the pancreatic body, best visualized on coronal image 36 of series 6. Spleen: No splenomegaly. No focal mass lesion. Adrenals/Urinary Tract: No adrenal nodule or mass. Right kidney unremarkable. Tiny low-density lesions in the left kidney are too small to characterize but likely benign. No evidence for hydroureter. Bladder is markedly distended. Stomach/Bowel: Small hiatal hernia. Stomach otherwise unremarkable. Duodenum is normally positioned as is the ligament of Treitz. No small bowel wall thickening. No small bowel dilatation. The terminal ileum is normal. Normal appendix best appreciated on coronal imaging.: Unremarkable with short segment transverse colon extending out through a midline ventral hernia. No complicating features. Diverticular changes are noted in the left colon without evidence of diverticulitis. Vascular/Lymphatic: There is abdominal aortic atherosclerosis without aneurysm. There is no gastrohepatic or hepatoduodenal ligament lymphadenopathy. No intraperitoneal or retroperitoneal lymphadenopathy. No pelvic sidewall lymphadenopathy. Reproductive: Unremarkable. Other: No intraperitoneal free fluid. Musculoskeletal: No worrisome lytic or sclerotic osseous abnormality. Mild compression deformity noted T12 superior endplate. IMPRESSION: 1. No acute findings in the  abdomen or pelvis. 2. Midline ventral hernia contains short segment of transverse colon without complicating features. 3. Mild extrahepatic biliary duct dilatation in this patient status post cholecystectomy. Correlation with liver function tests suggested. 4. 12 mm cystic lesion pancreatic body. Given lesion size and patient age, follow-up CT abdomen with contrast in 2 years recommended to re-evaluate. This recommendation follows ACR consensus guidelines: Management of Incidental Pancreatic Cysts: A White Paper of the ACR Incidental Findings Committee. J Am Coll  Radiol 9166;06:004-599. 5.  Aortic Atherosclerois (ICD10-170.0) 6. Colonic diverticulosis without diverticulitis. 7. Mild T12 compression deformity. Please see lumbar spine CT reported separately. Electronically Signed   By: Misty Stanley M.D.   On: 01/02/2019 22:45   Ct L-spine No Charge  Result Date: 01/02/2019 CLINICAL DATA:  Fall EXAM: CT LUMBAR SPINE WITHOUT CONTRAST TECHNIQUE: Multidetector CT imaging of the lumbar spine was performed without intravenous contrast administration. Multiplanar CT image reconstructions were also generated. COMPARISON:  None. FINDINGS: Segmentation: 5 lumbar type vertebrae. Alignment: Normal. Vertebrae: There is an acute wedge compression fracture of T12 with approximately 10-20% height loss. No retropulsion. The fracture involves the superior endplate and anterior wall. No other fracture. Paraspinal and other soft tissues: Please see dedicated report for CT abdomen pelvis. Disc levels: There is no large disc herniation or bony spinal canal stenosis. There is moderate right and mild left foraminal narrowing at L5-S1 due to disc space narrowing and endplate spurring. IMPRESSION: 1. Acute wedge compression fracture of T12 with approximately 10-20% height loss, involving the superior endplate and anterior wall. No retropulsion. 2. Moderate right and mild left foraminal narrowing at L5-S1 due to disc space narrowing and endplate spurring. Electronically Signed   By: Ulyses Jarred M.D.   On: 01/02/2019 22:40     Scheduled Meds:  enoxaparin (LOVENOX) injection  40 mg Subcutaneous Q24H   feeding supplement (ENSURE ENLIVE)  237 mL Oral BID BM   Continuous Infusions:  sodium chloride 125 mL/hr (01/03/19 0256)   piperacillin-tazobactam (ZOSYN)  IV 3.375 g (01/03/19 0959)   vancomycin 1,250 mg (01/03/19 0303)    Principal Problem:   Fall Active Problems:   Leukocytosis   Vertebral compression fracture (HCC)   Diarrhea   Elevated troponin  Time spent:    Irwin Brakeman, MD [facility] 01/03/2019, 10:19 AM    LOS: 0 days  How to contact the [facility] Attending or Consulting provider [place] or covering provider during after hours [place], for this patient?  1. Check the care team in [facility] and look for a) attending/consulting TR[facility] provider listed and b) the [facility] team listed 2. Log into [email or web address] and use [employer]'s universal password to access. If you do not have the password, please contact the hospital operator. 3. Locate the [facility] provider you are looking for under [facility] and page to a number that you can be directly reached. 4. If you still have difficulty reaching the provider, please page the [facility] (Director on Call) for the Hospitalists listed on amion for assistance.

## 2019-01-03 NOTE — Progress Notes (Signed)
  Pharmacy Antibiotic Note  Michelle Sanders is a 68 y.o. female admitted on 01/02/2019 with leukocytosis of unknown source.  Pharmacy has been consulted for zosyn and vancomycin dosing.  Plan: Vancomycin 1250 IV every 24 hours.  Goal trough 10-15 mcg/mL.  Zosyn 3.375 gm IV q 8hr--extended infusion  Height: 5' (152.4 cm) Weight: 139 lb (63 kg) IBW/kg (Calculated) : 45.5  Temp (24hrs), Avg:99.7 F (37.6 C), Min:99.7 F (37.6 C), Max:99.7 F (37.6 C)  Recent Labs  Lab 01/02/19 2029 01/02/19 2048 01/02/19 2237  WBC 24.8*  --   --   CREATININE 0.89  --   --   LATICACIDVEN  --  1.4 1.0    Estimated Creatinine Clearance: 50.1 mL/min (by C-G formula based on SCr of 0.89 mg/dL).    Allergies  Allergen Reactions  . Prednisone   . Zanaflex [Tizanidine Hcl]     Makes heart race    Antimicrobials this admission: none  Thank you for allowing pharmacy to be a part of this patient's care.  Wyline Mood 01/03/2019 12:41 AM

## 2019-01-03 NOTE — Progress Notes (Signed)
EEG completed, results pending. 

## 2019-01-03 NOTE — Plan of Care (Signed)
  Problem: Acute Rehab OT Goals (only OT should resolve) Goal: Pt. Will Perform Grooming Flowsheets (Taken 01/03/2019 1110) Pt Will Perform Grooming: with supervision; standing Goal: Pt. Will Perform Upper Body Bathing Flowsheets (Taken 01/03/2019 1110) Pt Will Perform Upper Body Bathing: with set-up; sitting Goal: Pt. Will Perform Lower Body Bathing Flowsheets (Taken 01/03/2019 1110) Pt Will Perform Lower Body Bathing: with min assist; sit to/from stand; sitting/lateral leans Goal: Pt. Will Perform Lower Body Dressing Flowsheets (Taken 01/03/2019 1110) Pt Will Perform Lower Body Dressing: with min assist; sit to/from stand; sitting/lateral leans Goal: Pt. Will Transfer To Toilet Flowsheets (Taken 01/03/2019 1110) Pt Will Transfer to Toilet: with supervision; ambulating; grab bars; regular height toilet Goal: Pt. Will Perform Toileting-Clothing Manipulation Flowsheets (Taken 01/03/2019 1110) Pt Will Perform Toileting - Clothing Manipulation and hygiene: with supervision; sitting/lateral leans; sit to/from stand Goal: Pt/Caregiver Will Perform Home Exercise Program Flowsheets (Taken 01/03/2019 1110) Pt/caregiver will Perform Home Exercise Program: Increased strength; Both right and left upper extremity; With Supervision; With written HEP provided

## 2019-01-03 NOTE — TOC Initial Note (Signed)
Transition of Care [facility]) - Initial/Assessment Note    Patient Details  Name: Michelle Sanders MRN: [id number] Date of Birth: June 17, 1951  [facility] CM/SW Contact:    Boneta Lucks, RN Phone Number: 01/03/2019, 2:40 PM  Clinical Narrative:    Patient lives at home alone, independent, drives herself as needed, Dr Gerarda Fraction as PCP, used [facility].  PT/OT recommend SNF, patient is agreeable and was offered choices, pt prefers [facility], refer sent.                 Expected Discharge Plan: Skilled Nursing Facility Barriers to Discharge: No Barriers Identified   Patient Goals and CMS Choice Patient states their goals for this hospitalization and ongoing recovery are:: PT wishes to go to rehabe and then discharged to home. CMS Medicare.gov Compare Post Acute Care list provided to:: Patient Choice offered to / list presented to : Patient  Expected Discharge Plan and Services Expected Discharge Plan: [place]   Discharge Planning Services: CM Consult Post Acute Care Choice: [place]                            Prior Living Arrangements/Services   Lives with:: Self Patient language and need for interpreter reviewed:: Yes Do you feel safe going back to the place where you live?: Yes      Need for Family Participation in Patient Care: No (Comment) Care giver support system in place?: No (comment) Current home services: DME([place] riser) Criminal Activity/Legal Involvement Pertinent to Current Situation/Hospitalization: No - Comment as needed  Activities of Daily Living Home Assistive Devices/Equipment: None ADL Screening (condition at time of admission) Patient's cognitive ability adequate to safely complete daily activities?: Yes Is the patient deaf or have difficulty hearing?: No Does the patient have difficulty seeing, even when wearing glasses/contacts?: No Does the patient have difficulty concentrating, remembering, or making  decisions?: No Patient able to express need for assistance with ADLs?: Yes Does the patient have difficulty dressing or bathing?: No Independently performs ADLs?: No Does the patient have difficulty walking or climbing stairs?: Yes Weakness of Legs: Both Weakness of Arms/Hands: None  Permission Sought/Granted   Permission granted to share information with : Yes, Verbal Permission Granted     Permission granted to share info w AGENCY: [facility]        Emotional Assessment   Attitude/Demeanor/Rapport: Engaged Affect (typically observed): Accepting, Calm Orientation: : Oriented to Place, Oriented to Self, Oriented to  Time, Oriented to Situation Alcohol / Substance Use: Not Applicable Psych Involvement: No (comment)  Admission diagnosis:  Dehydration [E86.0] Pain [R52] Diarrhea in adult patient [R19.7] T12 compression fracture, initial encounter ([place]) [H47.654Y] Fall in home, initial encounter [W19.Merril Abbe, T03.546] Fall [W19.XXXA] Patient Active Problem List   Diagnosis Date Noted  . Leukocytosis 01/03/2019  . Vertebral compression fracture ([place]) 01/03/2019  . Diarrhea 01/03/2019  . Elevated troponin 01/03/2019  . Fall 01/02/2019  . Ataxia 10/02/2015  . Falls 10/02/2015  . Numbness 10/02/2015  . Proximal muscle weakness 10/02/2015  . Neck muscle weakness 10/02/2015  . Acute confusional state 10/02/2015  . Fibromyalgia 10/02/2015  . Chronic pain syndrome 10/02/2015  . Generalized anxiety disorder 10/02/2015  . Depression 10/02/2015  . Hypothyroidism 10/02/2015  . COPD (chronic obstructive pulmonary disease) ([place]) 10/02/2015  . SHOULDER PAIN 10/02/2008  . IMPINGEMENT SYNDROME 10/02/2008   PCP:  Redmond School, MD Pharmacy:   [place], [place] -  [address] [place] [address] Phone: [phone] Fax: [phone]        Readmission Risk Interventions No flowsheet data found.

## 2019-01-03 NOTE — [place] FL2 (Signed)
[place] MEDICAID FL2 LEVEL OF CARE SCREENING TOOL     IDENTIFICATION  Patient Name: Michelle Sanders Birthdate: Jan 24, 1951 Sex: female Admission Date (Current Location): 01/02/2019  [facility] and [place] Number:  [employer] and Address:  [place] [address], [place]      Provider Number: [id number]  Attending Physician Name and Address:  Murlean Iba, MD  Relative Name and Phone Number:  [place] Sister [phone]     Current Level of Care: Other (Comment)(observation) Recommended Level of Care: Nursing Facility Prior Approval Number:    Date Approved/Denied:   PASRR Number: [id number]  Discharge Plan: SNF    Current Diagnoses: Patient Active Problem List   Diagnosis Date Noted  . Leukocytosis 01/03/2019  . Vertebral compression fracture ([place]) 01/03/2019  . Diarrhea 01/03/2019  . Elevated troponin 01/03/2019  . Fall 01/02/2019  . Ataxia 10/02/2015  . Falls 10/02/2015  . Numbness 10/02/2015  . Proximal muscle weakness 10/02/2015  . Neck muscle weakness 10/02/2015  . Acute confusional state 10/02/2015  . Fibromyalgia 10/02/2015  . Chronic pain syndrome 10/02/2015  . Generalized anxiety disorder 10/02/2015  . Depression 10/02/2015  . Hypothyroidism 10/02/2015  . COPD (chronic obstructive pulmonary disease) ([place]) 10/02/2015  . SHOULDER PAIN 10/02/2008  . IMPINGEMENT SYNDROME 10/02/2008    Orientation RESPIRATION BLADDER Height & Weight     Self, Time, Situation, Place  Normal Continent Weight: 53.8 kg Height:  5\' 1"  (154.9 cm)  BEHAVIORAL SYMPTOMS/MOOD NEUROLOGICAL BOWEL NUTRITION STATUS      Continent Diet(Heart Healthy)  AMBULATORY STATUS COMMUNICATION OF NEEDS Skin   Extensive Assist Verbally Normal                       Personal Care Assistance Level of Assistance  Bathing, Feeding, Dressing Bathing Assistance: Limited assistance Feeding assistance: Independent Dressing Assistance: Limited  assistance     Functional Limitations Info  Hearing, Speech, Sight Sight Info: Adequate Hearing Info: Adequate Speech Info: Adequate    SPECIAL CARE FACTORS FREQUENCY  PT (By licensed PT), OT (By licensed OT)     PT Frequency: 5 times a week OT Frequency: 3 times a week            Contractures Contractures Info: Not present    Additional Factors Info  Code Status, Allergies, Psychotropic Code Status Info: Full Allergies Info: Prednisone  Zanaflex Psychotropic Info: Xanax         Current Medications (01/03/2019):  This is the current hospital active medication list Current Facility-Administered Medications  Medication Dose Route Frequency Provider Last Rate Last Dose  . 0.9 %  sodium chloride infusion   Intravenous Continuous Johnson, Clanford L, MD 50 mL/hr at 01/03/19 1334    . acetaminophen (TYLENOL) tablet 650 mg  650 mg Oral Q6H PRN Shela Leff, MD       Or  . acetaminophen (TYLENOL) suppository 650 mg  650 mg Rectal Q6H PRN Shela Leff, MD      . albuterol (PROVENTIL) (2.5 MG/3ML) 0.083% nebulizer solution 3 mL  3 mL Inhalation Q6H PRN Johnson, Clanford L, MD      . ALPRAZolam Duanne Moron) tablet 0.25 mg  0.25 mg Oral QID PRN Johnson, Clanford L, MD      . aspirin EC tablet 81 mg  81 mg Oral Daily Johnson, Clanford L, MD   81 mg at 01/03/19 1047  . enoxaparin (LOVENOX) injection 40 mg  40 mg Subcutaneous Q24H Shela Leff, MD  40 mg at 01/03/19 0206  . feeding supplement (ENSURE ENLIVE) (ENSURE ENLIVE) liquid 237 mL  237 mL Oral BID BM Johnson, Clanford L, MD   237 mL at 01/03/19 1336  . hydrALAZINE (APRESOLINE) injection 5 mg  5 mg Intravenous Q4H PRN Johnson, Clanford L, MD      . HYDROcodone-acetaminophen (NORCO/VICODIN) 5-325 MG per tablet 1-2 tablet  1-2 tablet Oral Q6H PRN Shela Leff, MD   1 tablet at 01/03/19 0426  . [START ON 01/04/2019] levothyroxine (SYNTHROID) tablet 100 mcg  100 mcg Oral QAC breakfast Johnson, Clanford L, MD      .  lisinopril (ZESTRIL) tablet 30 mg  30 mg Oral Daily Johnson, Clanford L, MD   30 mg at 01/03/19 1047  . loperamide (IMODIUM) capsule 2 mg  2 mg Oral PRN Shela Leff, MD      . mometasone-formoterol (DULERA) 200-5 MCG/ACT inhaler 2 puff  2 puff Inhalation BID Wynetta Emery, Clanford L, MD   2 puff at 01/03/19 1142  . morphine 2 MG/ML injection 1 mg  1 mg Intravenous Q3H PRN Shela Leff, MD   1 mg at 01/03/19 4720  . multivitamin with minerals tablet 1 tablet  1 tablet Oral Daily Wynetta Emery, Clanford L, MD   1 tablet at 01/03/19 1244  . pantoprazole (PROTONIX) EC tablet 40 mg  40 mg Oral Daily Johnson, Clanford L, MD   40 mg at 01/03/19 1047     Discharge Medications: Please see discharge summary for a list of discharge medications.  Relevant Imaging Results:  Relevant Lab Results:   Additional Information SSN [id number]  Boneta Lucks, RN

## 2019-01-03 NOTE — Plan of Care (Signed)
  Problem: Acute Rehab PT Goals(only PT should resolve) Goal: Patient Will Transfer Sit To/From Stand Outcome: Progressing Flowsheets (Taken 01/03/2019 1346) Patient will transfer sit to/from stand: with min guard assist Goal: Pt Will Transfer Bed To Chair/Chair To Bed Outcome: Progressing Flowsheets (Taken 01/03/2019 1346) Pt will Transfer Bed to Chair/Chair to Bed: min guard assist Goal: Pt Will Ambulate Outcome: Progressing Flowsheets (Taken 01/03/2019 1346) Pt will Ambulate: 75 feet; with rolling walker; with min guard assist Goal: Pt Will Go Supine/Side To Sit 01/03/2019 1347 by Lonell Grandchild, PT Flowsheets (Taken 01/03/2019 1347) Pt will go Supine/Side to Sit: with min guard assist 01/03/2019 1346 by Lonell Grandchild, PT Outcome: Progressing   1:47 PM, 01/03/19 Lonell Grandchild, MPT Physical Therapist with [facility] [phone] office [phone] mobile phone

## 2019-01-03 NOTE — TOC Progression Note (Signed)
Transition of Care [facility]) - Progression Note    Patient Details  Name: Michelle Sanders MRN: [id number] Date of Birth: 06-19-1951  Transition of Care [facility]) CM/SW Contact  Aaralynn Shepheard, Chauncey Reading, RN Phone Number: 01/03/2019, 4:07 PM  Clinical Narrative:   [facility] has offered bed. Patient agreeable. Insurance authorization has been started.     Expected Discharge Plan: [place] Barriers to Discharge: No Barriers Identified  Expected Discharge Plan and Services Expected Discharge Plan: [place]   Discharge Planning Services: CM Consult Post Acute Care Choice: Skilled Nursing Facility                             Social Determinants of Health (SDOH) Interventions    Readmission Risk Interventions No flowsheet data found.

## 2019-01-03 NOTE — Evaluation (Signed)
Physical Therapy Evaluation Patient Details Name: Michelle Sanders MRN: [id number] DOB: [birth year] Today's Date: 01/03/2019   History of Present Illness  Michelle Sanders is a 68 y.o. female with medical history significant of arthritis, ataxia, chronic pain, COPD, falls, fibromyalgia, hypertension presenting to the hospital for evaluation of a fall.  Patient states she was walking to the bathroom at home, lost her balance and fell.  She hit her head.  She was not using a walker to ambulate.  Does report feeling lightheaded prior to the fall.  Denies any chest pain or shortness of breath at that time.  States she did not lose consciousness.  Reports having severe lower back pain since the fall.  States she has not been eating much for the past 2 days.  Denies any nausea, vomiting, or abdominal pain.  Does report diarrhea, unclear when this started.  Denies recent antibiotic use.  Denies any fevers, chills, cough, sick contacts, or exposure to any individual with confirmed COVID-19.  Denies any dysuria, urinary frequency, or urgency.    Clinical Impression  Patient demonstrates slow labored movement for sitting up at bedside, transfers and ambulation using RW with c/o increasing low back pain, leans on nearby objects for support when not using RW and at risk for falls.  Patient requested to go back to bed after therapy due to fatigue.  Patient will benefit from continued physical therapy in hospital and recommended venue below to increase strength, balance, endurance for safe ADLs and gait.    Follow Up Recommendations SNF    Equipment Recommendations  Rolling walker with 5" wheels    Recommendations for Other Services       Precautions / Restrictions Precautions Precautions: Fall Restrictions Weight Bearing Restrictions: No      Mobility  Bed Mobility Overal bed mobility: Needs Assistance Bed Mobility: Sidelying to Sit;Sit to Sidelying;Rolling Rolling: Min assist Sidelying to sit: Min  assist     Sit to sidelying: Min assist General bed mobility comments: slow labored movement with c/o increased low back pain  Transfers Overall transfer level: Needs assistance Equipment used: Rolling walker (2 wheeled);None Transfers: Sit to/from [employer] to Stand: Min assist Stand pivot transfers: Min assist       General transfer comment: had difficulty sit to stands without AD due to increasing low back pain, required RW for safety  Ambulation/Gait Ambulation/Gait assistance: Min assist Gait Distance (Feet): 30 Feet Assistive device: Rolling walker (2 wheeled) Gait Pattern/deviations: Decreased step length - right;Decreased step length - left;Decreased stride length Gait velocity: decreased   General Gait Details: demonstrates slow labored cadence, limited secondary to c/o increasing low back pain  Stairs            Wheelchair Mobility    Modified Rankin (Stroke Patients Only)       Balance Overall balance assessment: Needs assistance Sitting-balance support: Feet supported;No upper extremity supported Sitting balance-Leahy Scale: Fair     Standing balance support: No upper extremity supported;During functional activity Standing balance-Leahy Scale: Poor Standing balance comment: fair using RW                             Pertinent Vitals/Pain Pain Assessment: 0-10 Pain Score: 8  Pain Location: low back Pain Descriptors / Indicators: Sore;Grimacing;Aching;Constant Pain Intervention(s): Limited activity within patient's tolerance;Monitored during session    Home Living Family/patient expects to be discharged to:: Private residence Living Arrangements: Alone   Type  of Home: House Home Access: Stairs to enter Entrance Stairs-Rails: Right;Left;Can reach both Technical brewer of Steps: 4 Home Layout: One level Home Equipment: Cane - single point      Prior Function Level of Independence: Independent with  assistive device(s)         Comments: Hydrographic surveyor, drives     Journalist, newspaper        Extremity/Trunk Assessment   Upper Extremity Assessment Upper Extremity Assessment: Defer to OT evaluation    Lower Extremity Assessment Lower Extremity Assessment: Generalized weakness    Cervical / Trunk Assessment Cervical / Trunk Assessment: Kyphotic  Communication   Communication: No difficulties  Cognition Arousal/Alertness: Awake/alert Behavior During Therapy: WFL for tasks assessed/performed Overall Cognitive Status: Within Functional Limits for tasks assessed                                        General Comments      Exercises     Assessment/Plan    PT Assessment Patient needs continued PT services  PT Problem List Decreased strength;Decreased activity tolerance;Decreased balance;Decreased mobility       PT Treatment Interventions Therapeutic exercise;Gait training;Stair training;Functional mobility training;Therapeutic activities;Patient/family education    PT Goals (Current goals can be found in the Care Plan section)  Acute Rehab PT Goals Patient Stated Goal: to get strong enough to go home.  PT Goal Formulation: With patient Time For Goal Achievement: 01/17/19 Potential to Achieve Goals: Good    Frequency Min 3X/week   Barriers to discharge        Co-evaluation               AM-PAC PT "6 Clicks" Mobility  Outcome Measure Help needed turning from your back to your side while in a flat bed without using bedrails?: A Little Help needed moving from lying on your back to sitting on the side of a flat bed without using bedrails?: A Lot Help needed moving to and from a bed to a chair (including a wheelchair)?: A Little Help needed standing up from a chair using your arms (e.g., wheelchair or bedside chair)?: A Little Help needed to walk in hospital room?: A Lot Help needed climbing 3-5 steps with a railing? : A Lot 6 Click  Score: 15    End of Session   Activity Tolerance: Patient tolerated treatment well;Patient limited by fatigue Patient left: in bed;with call bell/phone within reach Nurse Communication: Mobility status PT Visit Diagnosis: Unsteadiness on feet (R26.81);Other abnormalities of gait and mobility (R26.89);Muscle weakness (generalized) (M62.81)    Time: 3086-5784 PT Time Calculation (min) (ACUTE ONLY): 25 min   Charges:   PT Evaluation $PT Eval Moderate Complexity: 1 Mod PT Treatments $Therapeutic Activity: 23-37 mins        1:44 PM, 01/03/19 Lonell Grandchild, MPT Physical Therapist with [facility] [phone] office [phone] mobile phone

## 2019-01-03 NOTE — Evaluation (Signed)
Occupational Therapy Evaluation Patient Details Name: Michelle Sanders MRN: [id number] DOB: 26-Jul-1951 Today's Date: 01/03/2019    History of Present Illness Michelle Sanders is a 68 y.o. female with medical history significant of arthritis, ataxia, chronic pain, COPD, falls, fibromyalgia, hypertension presenting to the hospital for evaluation of a fall.  Patient states she was walking to the bathroom at home, lost her balance and fell.  She hit her head.  She was not using a walker to ambulate.  Does report feeling lightheaded prior to the fall.  Denies any chest pain or shortness of breath at that time.  States she did not lose consciousness.  Reports having severe lower back pain since the fall.  States she has not been eating much for the past 2 days.  Denies any nausea, vomiting, or abdominal pain.  Does report diarrhea, unclear when this started.  Denies recent antibiotic use.  Denies any fevers, chills, cough, sick contacts, or exposure to any individual with confirmed COVID-19.  Denies any dysuria, urinary frequency, or urgency.   Clinical Impression   Pt in bed upon therapy arrival and agreeable to participate in OT evaluation. Patient presents with decrease UB strength and endurance due to recent thoracic compression fracture resulting in difficulty completing daily tasks. Patient is unable to return home at this level as she lives alone. She is open to rehab prior to discharging home. Patient will benefit from skilled OT services to increase functional performance and allow her to return home as her prior level of independence. Pt will remain on OT caseload during course of hospitalization to focus on mentioned deficits.     Follow Up Recommendations  SNF    Equipment Recommendations  Tub/shower seat       Precautions / Restrictions Precautions Precautions: Fall Restrictions Weight Bearing Restrictions: No      Mobility Bed Mobility Overal bed mobility: Needs Assistance Bed  Mobility: Sidelying to Sit;Rolling Rolling: Min assist Sidelying to sit: Max assist          Transfers Overall transfer level: Needs assistance Equipment used: Rolling walker (2 wheeled) Transfers: Sit to/from [facility] Sit to Stand: Min assist Stand pivot transfers: Min assist                ADL either performed or assessed with clinical judgement   ADL Overall ADL's : Needs assistance/impaired Eating/Feeding: Set up;Sitting   Grooming: Wash/dry hands;Min guard;Standing               Lower Body Dressing: Total assistance;Sit to/from stand;Sitting/lateral leans   Toilet Transfer: Minimal assistance;Regular Toilet;Grab bars;RW;Ambulation;Cueing for sequencing;Cueing for safety     Toileting - Clothing Manipulation Details (indicate cue type and reason): Patient able to retrieve toilet paper and complete toilet hygiene with Supervision using lateral leans while sitting.      Functional mobility during ADLs: Minimal assistance;Cueing for safety;Cueing for sequencing;Rolling walker       Vision Baseline Vision/History: No visual deficits Patient Visual Report: No change from baseline              Pertinent Vitals/Pain Pain Assessment: 0-10 Pain Score: 7  Pain Location: all over. Back is worse Pain Descriptors / Indicators: Constant Pain Intervention(s): Limited activity within patient's tolerance;Monitored during session;Premedicated before session;Repositioned     Hand Dominance Right   Extremity/Trunk Assessment Upper Extremity Assessment Upper Extremity Assessment: Generalized weakness   Lower Extremity Assessment Lower Extremity Assessment: Defer to PT evaluation       Communication Communication  Communication: No difficulties   Cognition Arousal/Alertness: Awake/alert Behavior During Therapy: WFL for tasks assessed/performed Overall Cognitive Status: Within Functional Limits for tasks assessed       \            Home Living Family/patient expects to be discharged to:: Private residence Living Arrangements: Alone   Type of Home: House Home Access: Stairs to enter Technical brewer of Steps: 4 Entrance Stairs-Rails: Right;Left;Can reach both Home Layout: One level     Bathroom Shower/Tub: Occupational psychologist: [place]: [place] - single point          Prior Functioning/Environment Level of Independence: Independent with assistive device(s)        Comments: pt drives, completes housekeeping tasks, and meal prep        OT Problem List: Decreased strength;Decreased knowledge of use of DME or AE;Pain;Decreased safety awareness;Decreased knowledge of precautions;Decreased activity tolerance;Impaired balance (sitting and/or standing)      OT Treatment/Interventions: Self-care/ADL training;Modalities;Balance training;Therapeutic exercise;Neuromuscular education;Therapeutic activities;DME and/or AE instruction;Manual therapy;Patient/family education    OT Goals(Current goals can be found in the care plan section) Acute Rehab OT Goals Patient Stated Goal: to get strong enough to go home.  OT Goal Formulation: With patient Time For Goal Achievement: 01/17/19 Potential to Achieve Goals: Good  OT Frequency: Min 2X/week   Barriers to D/C: Decreased caregiver support  Pt lives alone       \   AM-PAC OT "6 Clicks" Daily Activity     Outcome Measure Help from another person eating meals?: A Little Help from another person taking care of personal grooming?: A Little Help from another person toileting, which includes using toliet, bedpan, or urinal?: A Little Help from another person bathing (including washing, rinsing, drying)?: Total Help from another person to put on and taking off regular upper body clothing?: Total Help from another person to put on and taking off regular lower body clothing?: Total 6 Click Score: 12   End of Session Equipment  Utilized During Treatment: Gait belt;Rolling walker  Activity Tolerance: Patient tolerated treatment well;Patient limited by pain Patient left: in chair;with call bell/phone within reach;with chair alarm set  OT Visit Diagnosis: Muscle weakness (generalized) (M62.81)                Time: 3491-7915 OT Time Calculation (min): 40 min Charges:  OT Evaluation $OT Eval Moderate Complexity: [address] OTR/L,CBIS  [phone]   Edison Nicholson, Clarene Duke 01/03/2019, 11:07 AM

## 2019-01-03 NOTE — Procedures (Signed)
ELECTROENCEPHALOGRAM REPORT   Patient: Michelle Sanders       Room #: A301 EEG No. ID: [id number] Age: 68 y.o.        Sex: female Referring Physician: Wynetta Emery Report Date:  01/03/2019        Interpreting Physician: Alexis Goodell  History: [name] is an 68 y.o. female with a fall  Medications:  ASA, Synthroid, Zestril, Dulera, MVI, Protonix  Conditions of Recording:  This is a 21 channel routine scalp EEG performed with bipolar and monopolar montages arranged in accordance to the international 10/20 system of electrode placement. One channel was dedicated to EKG recording.  The patient is in the awake and briefly drowsy states.  Description:  The waking background activity consists of a low voltage, symmetrical, fairly well organized, 9 Hz alpha activity, seen from the parieto-occipital and posterior temporal regions.  Low voltage fast activity, poorly organized, is seen anteriorly and is at times superimposed on more posterior regions.  A mixture of theta and alpha rhythms are seen from the central and temporal regions. The patient drowses briefly with slowing to irregular, low voltage theta and beta activity.   Stage II sleep is not obtained. Hyperventilation and intermittent photic stimulation were not performed.  IMPRESSION: Normal electroencephalogram, awake and briefly drowsy. There are no focal lateralizing or epileptiform features.   Alexis Goodell, MD Neurology [phone] 01/03/2019, 5:52 PM

## 2019-01-04 ENCOUNTER — Inpatient Hospital Stay
Admission: RE | Admit: 2019-01-04 | Discharge: 2019-01-17 | Disposition: A | Discharge status: Home / Self Care | Payer: PPO | Source: Ambulatory Visit | Attending: Internal Medicine | Admitting: Internal Medicine

## 2019-01-04 ENCOUNTER — Other Ambulatory Visit: Payer: Self-pay | Admitting: Adult Health

## 2019-01-04 DIAGNOSIS — R270 Ataxia, unspecified: Secondary | ICD-10-CM | POA: Diagnosis not present

## 2019-01-04 DIAGNOSIS — M797 Fibromyalgia: Secondary | ICD-10-CM | POA: Diagnosis not present

## 2019-01-04 DIAGNOSIS — M6281 Muscle weakness (generalized): Secondary | ICD-10-CM | POA: Diagnosis not present

## 2019-01-04 DIAGNOSIS — N390 Urinary tract infection, site not specified: Secondary | ICD-10-CM | POA: Diagnosis not present

## 2019-01-04 DIAGNOSIS — R262 Difficulty in walking, not elsewhere classified: Secondary | ICD-10-CM | POA: Diagnosis not present

## 2019-01-04 DIAGNOSIS — J449 Chronic obstructive pulmonary disease, unspecified: Secondary | ICD-10-CM | POA: Diagnosis not present

## 2019-01-04 DIAGNOSIS — E876 Hypokalemia: Secondary | ICD-10-CM | POA: Diagnosis not present

## 2019-01-04 DIAGNOSIS — Z4789 Encounter for other orthopedic aftercare: Secondary | ICD-10-CM | POA: Diagnosis not present

## 2019-01-04 DIAGNOSIS — R279 Unspecified lack of coordination: Secondary | ICD-10-CM | POA: Diagnosis not present

## 2019-01-04 DIAGNOSIS — K862 Cyst of pancreas: Secondary | ICD-10-CM | POA: Diagnosis not present

## 2019-01-04 DIAGNOSIS — S22080D Wedge compression fracture of T11-T12 vertebra, subsequent encounter for fracture with routine healing: Secondary | ICD-10-CM | POA: Diagnosis not present

## 2019-01-04 DIAGNOSIS — G894 Chronic pain syndrome: Secondary | ICD-10-CM | POA: Diagnosis not present

## 2019-01-04 DIAGNOSIS — D72829 Elevated white blood cell count, unspecified: Secondary | ICD-10-CM | POA: Diagnosis not present

## 2019-01-04 DIAGNOSIS — E039 Hypothyroidism, unspecified: Secondary | ICD-10-CM | POA: Diagnosis not present

## 2019-01-04 DIAGNOSIS — I10 Essential (primary) hypertension: Secondary | ICD-10-CM | POA: Diagnosis not present

## 2019-01-04 DIAGNOSIS — W19XXXD Unspecified fall, subsequent encounter: Secondary | ICD-10-CM | POA: Diagnosis not present

## 2019-01-04 DIAGNOSIS — E440 Moderate protein-calorie malnutrition: Secondary | ICD-10-CM | POA: Diagnosis not present

## 2019-01-04 DIAGNOSIS — R7989 Other specified abnormal findings of blood chemistry: Secondary | ICD-10-CM | POA: Diagnosis not present

## 2019-01-04 DIAGNOSIS — G8929 Other chronic pain: Secondary | ICD-10-CM | POA: Diagnosis not present

## 2019-01-04 DIAGNOSIS — B9620 Unspecified Escherichia coli [E. coli] as the cause of diseases classified elsewhere: Secondary | ICD-10-CM | POA: Diagnosis not present

## 2019-01-04 DIAGNOSIS — F411 Generalized anxiety disorder: Secondary | ICD-10-CM | POA: Diagnosis not present

## 2019-01-04 DIAGNOSIS — K219 Gastro-esophageal reflux disease without esophagitis: Secondary | ICD-10-CM | POA: Diagnosis not present

## 2019-01-04 DIAGNOSIS — S22080A Wedge compression fracture of T11-T12 vertebra, initial encounter for closed fracture: Secondary | ICD-10-CM | POA: Diagnosis not present

## 2019-01-04 DIAGNOSIS — M159 Polyosteoarthritis, unspecified: Secondary | ICD-10-CM | POA: Diagnosis not present

## 2019-01-04 DIAGNOSIS — Z9181 History of falling: Secondary | ICD-10-CM | POA: Diagnosis not present

## 2019-01-04 LAB — CBC WITH DIFFERENTIAL/PLATELET
Abs Immature Granulocytes: 0.06 10*3/uL (ref 0.00–0.07)
Basophils Absolute: 0.1 10*3/uL (ref 0.0–0.1)
Basophils Relative: 0 %
Eosinophils Absolute: 0.1 10*3/uL (ref 0.0–0.5)
Eosinophils Relative: 1 %
HCT: 41.1 % (ref 36.0–46.0)
Hemoglobin: 12.7 g/dL (ref 12.0–15.0)
Immature Granulocytes: 1 %
Lymphocytes Relative: 19 %
Lymphs Abs: 2.4 10*3/uL (ref 0.7–4.0)
MCH: 26.7 pg (ref 26.0–34.0)
MCHC: 30.9 g/dL (ref 30.0–36.0)
MCV: 86.5 fL (ref 80.0–100.0)
Monocytes Absolute: 0.9 10*3/uL (ref 0.1–1.0)
Monocytes Relative: 7 %
Neutro Abs: 9.2 10*3/uL — ABNORMAL HIGH (ref 1.7–7.7)
Neutrophils Relative %: 72 %
Platelets: 307 10*3/uL (ref 150–400)
RBC: 4.75 MIL/uL (ref 3.87–5.11)
RDW: 14.2 % (ref 11.5–15.5)
WBC: 12.6 10*3/uL — ABNORMAL HIGH (ref 4.0–10.5)
nRBC: 0 % (ref 0.0–0.2)

## 2019-01-04 LAB — COMPREHENSIVE METABOLIC PANEL
ALT: 12 U/L (ref 0–44)
AST: 20 U/L (ref 15–41)
Albumin: 3 g/dL — ABNORMAL LOW (ref 3.5–5.0)
Alkaline Phosphatase: 61 U/L (ref 38–126)
Anion gap: 6 (ref 5–15)
BUN: 17 mg/dL (ref 8–23)
CO2: 22 mmol/L (ref 22–32)
Calcium: 8.4 mg/dL — ABNORMAL LOW (ref 8.9–10.3)
Chloride: 109 mmol/L (ref 98–111)
Creatinine, Ser: 0.69 mg/dL (ref 0.44–1.00)
GFR calc Af Amer: >60 mL/min (ref >60)
GFR calc non Af Amer: >60 mL/min (ref >60)
Glucose, Bld: 82 mg/dL (ref 70–99)
Potassium: 3.3 mmol/L — ABNORMAL LOW (ref 3.5–5.1)
Sodium: 137 mmol/L (ref 135–145)
Total Bilirubin: 0.5 mg/dL (ref 0.3–1.2)
Total Protein: 5.9 g/dL — ABNORMAL LOW (ref 6.5–8.1)

## 2019-01-04 LAB — HIV ANTIBODY (ROUTINE TESTING W REFLEX): HIV Screen 4th Generation wRfx: NONREACTIVE

## 2019-01-04 LAB — CK: Total CK: 403 U/L — ABNORMAL HIGH (ref 38–234)

## 2019-01-04 MED ORDER — ACETAMINOPHEN 325 MG PO TABS: 650.0000 mg | ORAL_TABLET | Freq: Four times a day (QID) | ORAL | Status: AC | PRN

## 2019-01-04 MED ORDER — ESOMEPRAZOLE MAGNESIUM 40 MG PO CPDR: 40.0000 mg | DELAYED_RELEASE_CAPSULE | Freq: Every day | ORAL | Status: DC

## 2019-01-04 MED ORDER — HYDROCODONE-ACETAMINOPHEN 5-325 MG PO TABS: 1.0000 | ORAL_TABLET | Freq: Four times a day (QID) | ORAL | 0 refills | Status: DC | PRN

## 2019-01-04 MED ORDER — POTASSIUM CHLORIDE CRYS ER 20 MEQ PO TBCR
40.0000 meq | EXTENDED_RELEASE_TABLET | Freq: Once | ORAL | Status: AC
  Administered 2019-01-04: 09:00:00 40 meq via ORAL
  Filled 2019-01-04: qty 2

## 2019-01-04 MED ORDER — ALPRAZOLAM 0.25 MG PO TABS: 0.2500 mg | ORAL_TABLET | Freq: Three times a day (TID) | ORAL | 0 refills | Status: DC | PRN

## 2019-01-04 MED ORDER — ENSURE ENLIVE PO LIQD: 237.0000 mL | Freq: Two times a day (BID) | ORAL | 12 refills | Status: AC

## 2019-01-04 MED ORDER — CEFDINIR 300 MG PO CAPS: 300.0000 mg | ORAL_CAPSULE | Freq: Two times a day (BID) | ORAL | 0 refills | Status: DC

## 2019-01-04 MED ORDER — BUDESONIDE-FORMOTEROL FUMARATE 160-4.5 MCG/ACT IN AERO: 2.0000 | INHALATION_SPRAY | Freq: Two times a day (BID) | RESPIRATORY_TRACT | 12 refills | Status: DC

## 2019-01-04 MED ORDER — ADULT MULTIVITAMIN W/MINERALS CH: 1.0000 | ORAL_TABLET | Freq: Every day | ORAL | Status: AC

## 2019-01-04 NOTE — Progress Notes (Signed)
Physical Therapy Treatment Patient Details Name: Michelle Sanders MRN: [id number] DOB: 08/27/1951 Today's Date: 01/04/2019    History of Present Illness Michelle Sanders is a 69 y.o. female with medical history significant of arthritis, ataxia, chronic pain, COPD, falls, fibromyalgia, hypertension presenting to the hospital for evaluation of a fall.  Patient states she was walking to the bathroom at home, lost her balance and fell.  She hit her head.  She was not using a walker to ambulate.  Does report feeling lightheaded prior to the fall.  Denies any chest pain or shortness of breath at that time.  States she did not lose consciousness.  Reports having severe lower back pain since the fall.  States she has not been eating much for the past 2 days.  Denies any nausea, vomiting, or abdominal pain.  Does report diarrhea, unclear when this started.  Denies recent antibiotic use.  Denies any fevers, chills, cough, sick contacts, or exposure to any individual with confirmed COVID-19.  Denies any dysuria, urinary frequency, or urgency.    PT Comments    Pt supine in bed and willing to participate, limited by LBP pain scale 8/10.  RN aware of pain and was given pain medication prior tx.  Pt reports need for restroom, ambulated with restroom with RW with min A slow labored movements.  Pt given verbal cueing to assist with sit to stand from bed and commade, increased assistance required from commade due to weakness and lower surface.  EOS pt left in chair with call bell within reach and chair alarm set.  No reports of increased pain, was limited by fatigue.    Follow Up Recommendations  SNF     Equipment Recommendations  Rolling walker with 5" wheels    Recommendations for Other Services       Precautions / Restrictions Precautions Precautions: Fall Restrictions Weight Bearing Restrictions: No    Mobility  Bed Mobility Overal bed mobility: Needs Assistance Bed Mobility: Supine to Sit    Sidelying to sit: Min assist       General bed mobility comments: slow labored movement with c/o increased low back pain, cueing for handplacement and use of handrails to assist with supine to sit  Transfers Overall transfer level: Modified independent Equipment used: Rolling walker (2 wheeled);None Transfers: Sit to/from Stand Sit to Stand: Min assist         General transfer comment: Use of RW for safety and handplacement to assist with sit to stand.  Increased assisted required from commade due to lower surface  Ambulation/Gait Ambulation/Gait assistance: Min assist Gait Distance (Feet): 35 Feet Assistive device: Rolling walker (2 wheeled) Gait Pattern/deviations: Decreased step length - right;Decreased step length - left;Decreased stride length Gait velocity: decreased   General Gait Details: demonstrates slow labored cadence, limited secondary to c/o increasing low back pain   Stairs             Wheelchair Mobility    Modified Rankin (Stroke Patients Only)       Balance                                            Cognition Arousal/Alertness: Awake/alert Behavior During Therapy: WFL for tasks assessed/performed Overall Cognitive Status: Within Functional Limits for tasks assessed  Exercises General Exercises - Lower Extremity Long Arc Quad: Both;10 reps;Seated Hip Flexion/Marching: Both;10 reps;Seated Toe Raises: Both;10 reps;Seated Heel Raises: Both;10 reps;Seated    General Comments        Pertinent Vitals/Pain Pain Score: 8  Pain Location: low back Pain Descriptors / Indicators: Sore;Grimacing;Aching;Constant Pain Intervention(s): Premedicated before session;Monitored during session;Limited activity within patient's tolerance    Home Living                      Prior Function            PT Goals (current goals can now be found in the care plan section)       Frequency    Min 3X/week      PT Plan      Co-evaluation              AM-PAC PT "6 Clicks" Mobility   Outcome Measure  Help needed turning from your back to your side while in a flat bed without using bedrails?: A Little Help needed moving from lying on your back to sitting on the side of a flat bed without using bedrails?: A Little Help needed moving to and from a bed to a chair (including a wheelchair)?: A Little Help needed standing up from a chair using your arms (e.g., wheelchair or bedside chair)?: A Little Help needed to walk in hospital room?: A Little Help needed climbing 3-5 steps with a railing? : A Lot 6 Click Score: 17    End of Session Equipment Utilized During Treatment: Gait belt Activity Tolerance: Patient tolerated treatment well;Patient limited by fatigue Patient left: in chair;with call bell/phone within reach;with chair alarm set Nurse Communication: Mobility status PT Visit Diagnosis: Unsteadiness on feet (R26.81);Other abnormalities of gait and mobility (R26.89);Muscle weakness (generalized) (M62.81)     Time: 1610-9604 PT Time Calculation (min) (ACUTE ONLY): 22 min  Charges:  $Gait Training: 8-22 mins $Therapeutic Activity: 8-22 mins                     [address], LPTA; [place]   Aldona Lento 01/04/2019, 12:04 PM

## 2019-01-04 NOTE — Progress Notes (Signed)
Nsg Discharge Note  Admit Date:  01/02/2019 Discharge date: 01/04/2019   Hosie Spangle to be D/C'd Skilled nursing facility per MD order.  AVS completed.  Copy for chart, and copy for patient signed, and dated. Patient/caregiver able to verbalize understanding.  Discharge Medication: Allergies as of 01/04/2019      Reactions   Prednisone    Zanaflex [tizanidine Hcl]    Makes heart race      Medication List    STOP taking these medications   furosemide 20 MG tablet Commonly known as:  LASIX   HYDROmorphone 8 MG tablet Commonly known as:  DILAUDID   hydrOXYzine 25 MG tablet Commonly known as:  ATARAX/VISTARIL     TAKE these medications   acetaminophen 325 MG tablet Commonly known as:  TYLENOL Take 2 tablets (650 mg total) by mouth every 6 (six) hours as needed for mild pain (or Fever >/= 101).   albuterol 108 (90 Base) MCG/ACT inhaler Commonly known as:  VENTOLIN HFA Inhale 2 puffs into the lungs every 6 (six) hours as needed for wheezing or shortness of breath.   ALPRAZolam 0.25 MG tablet Commonly known as:  XANAX Take 1 tablet (0.25 mg total) by mouth 3 (three) times daily as needed for anxiety. What changed:    medication strength  how much to take  when to take this  reasons to take this   aspirin 81 MG tablet Take 81 mg by mouth daily.   budesonide-formoterol 160-4.5 MCG/ACT inhaler Commonly known as:  SYMBICORT Inhale 2 puffs into the lungs 2 (two) times daily. Reported on 10/02/2015 What changed:  when to take this   cefdinir 300 MG capsule Commonly known as:  OMNICEF Take 1 capsule (300 mg total) by mouth 2 (two) times daily for 3 days.   esomeprazole 40 MG capsule Commonly known as:  NEXIUM Take 1 capsule (40 mg total) by mouth daily at 12 noon.   feeding supplement (ENSURE ENLIVE) Liqd Take 237 mLs by mouth 2 (two) times daily between meals.   HYDROcodone-acetaminophen 5-325 MG tablet Commonly known as:  NORCO/VICODIN Take 1 tablet by  mouth every 6 (six) hours as needed for severe pain.   levothyroxine 100 MCG tablet Commonly known as:  SYNTHROID Take 100 mcg by mouth daily before breakfast.   lisinopril 30 MG tablet Commonly known as:  ZESTRIL Take 30 mg by mouth daily.   multivitamin with minerals Tabs tablet Take 1 tablet by mouth daily. Start taking on:  January 05, 2019       Discharge Assessment: Vitals:   01/04/19 0633 01/04/19 0723  BP: (!) 145/77   Pulse: (!) 59   Resp: 17   Temp: 98.7 F (37.1 C)   SpO2: 94% 96%   Skin clean, dry and intact without evidence of skin break down, no evidence of skin tears noted. IV catheter discontinued intact. Site without signs and symptoms of complications - no redness or edema noted at insertion site, patient denies c/o pain - only slight tenderness at site.  Dressing with slight pressure applied.  D/c Instructions-Education: Discharge instructions given to patient/family with verbalized understanding. D/c education completed with patient/family including follow up instructions, medication list, d/c activities limitations if indicated, with other d/c instructions as indicated by MD - patient able to verbalize understanding, all questions fully answered. Patient instructed to return to ED, call 911, or call MD for any changes in condition.  Patient escorted via [place], and D/C to [facility] accompanied by  staff  Loa Socks, RN 01/04/2019 12:30 PM

## 2019-01-04 NOTE — Discharge Summary (Addendum)
Physician Discharge Summary  LAVORIS CANIZALES YDX:[id number] DOB: 11-13-1950 DOA: 01/02/2019  PCP: Redmond School, MD  Admit date: 01/02/2019 Discharge date: 01/04/2019  Admitted From:  Home  Disposition: [facility]   Recommendations for Outpatient Follow-up:  1. Follow up with PCP in 2 weeks 2. Please follow up final culture results.  3. Please obtain repeat CT abdomen with contrast in 1 year to follow pancreatic cyst  Discharge Condition: STABLE   CODE STATUS: FULL    Brief Hospitalization Summary: Please see all hospital notes, images, labs for full details of the hospitalization. Dr. Elon Jester HPI: Michelle Sanders is a 68 y.o. female with medical history significant of arthritis, ataxia, chronic pain, COPD, falls, fibromyalgia, hypertension presenting to the hospital for evaluation of a fall.  Patient states she was walking to the bathroom at home, lost her balance and fell.  She hit her head.  She was not using a walker to ambulate.  Does report feeling lightheaded prior to the fall.  Denies any chest pain or shortness of breath at that time.  States she did not lose consciousness.  Reports having severe lower back pain since the fall.  States she has not been eating much for the past 2 days.  Denies any nausea, vomiting, or abdominal pain.  Does report diarrhea, unclear when this started.  Denies recent antibiotic use.  Denies any fevers, chills, cough, sick contacts, or exposure to any individual with confirmed COVID-19.  Denies any dysuria, urinary frequency, or urgency.  Brief Admission Hx: 68 year old female presented by EMS after having a fall at home.  She has COPD, fibromyalgia hypertension, chronic pain and osteoarthritis.  She did not lose consciousness.  MDM/Assessment & Plan:   1. Fall at home-patient apparently never lost consciousness.  She has a mildly elevated CK which is being treated with IV fluids.  CT head negative for acute findings.  CT C-spine negative for acute  bony abnormalities.  CT of the lumbar spine shows evidence of an acute wedge compression fracture at T12.  She will be continued with supportive therapy.  PT/OT recommending SNF placement.  EEG negative for seizure activity. 2. Leukocytosis-etiology is likely secondary to gram negative rod UTI.  Treated with IV antibiotics and will discharge on oral cefdinir x 3 days.  Chest x-ray does not suggest pneumonia.  UA culture confirms gram negative rods >20000 CFU.  CT of the abdomen pelvis negative for source of infection and no cellulitis seen on skin examination.  Blood culture no growth to date.  3. Gram negative rod UTI - patient treated with IV antbiotics and discharging on cefdinir x 3 more days. 4. Hypertension - resumed home meds.  5. Hypothyroidism - resume home levothyroxine.  6. GAD - Pt apparently was taking alprazolam 1 mg 4 times daily which has been greatly reduced.  She is at high risk for withdrawal and did not discontinue but can be further weaned outpatient.   7. Acute vertebral compression fracture at T12-she is being treated supportively.  PT/OT recommending SNF. 8. Diarrhea-RESOLVED.  patient reported diarrhea on admission however she has not exhibited diarrhea since arriving on the floors.  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  DVT prophylaxis: Lovenox Code Status: Full Family Communication: Patient at bedside Disposition Plan: SNF   Consultants:  PT/OT  Procedures:  N/A  Antimicrobials:  Zosyn 4/19 >  Vancomycin 4/19 >   Discharge Diagnoses:  Principal Problem:   Fall Active Problems:   Leukocytosis   Vertebral compression fracture (HCC)   Diarrhea   Elevated troponin   Discharge Instructions: Discharge Instructions    Increase activity slowly   Complete  by:  As directed      Allergies as of 01/04/2019      Reactions   Prednisone    Zanaflex [tizanidine Hcl]    Makes heart race      Medication List    STOP taking these medications   furosemide 20 MG tablet Commonly known as:  LASIX   HYDROmorphone 8 MG tablet Commonly known as:  DILAUDID   hydrOXYzine 25 MG tablet Commonly known as:  ATARAX/VISTARIL     TAKE these medications   acetaminophen 325 MG tablet Commonly known as:  TYLENOL Take 2 tablets (650 mg total) by mouth every 6 (six) hours as needed for mild pain (or Fever >/= 101).   albuterol 108 (90 Base) MCG/ACT inhaler Commonly known as:  VENTOLIN HFA Inhale 2 puffs into the lungs every 6 (six) hours as needed for wheezing or shortness of breath.   ALPRAZolam 0.25 MG tablet Commonly known as:  XANAX Take 1 tablet (0.25 mg total) by mouth 3 (three) times daily as needed for anxiety. What changed:    medication strength  how much to take  when to take this  reasons to take this   aspirin 81 MG tablet Take 81 mg by mouth daily.   budesonide-formoterol 160-4.5 MCG/ACT inhaler Commonly known as:  SYMBICORT Inhale 2 puffs into the lungs 2 (two) times daily. Reported on 10/02/2015 What changed:  when to take this   cefdinir 300 MG capsule Commonly known as:  OMNICEF Take 1 capsule (300 mg total) by mouth 2 (two) times daily for 3 days.   esomeprazole 40 MG capsule Commonly known as:  NEXIUM Take 1 capsule (40 mg total) by mouth daily at 12 noon.   feeding supplement (ENSURE ENLIVE) Liqd Take 237 mLs by mouth 2 (two) times daily between meals.   HYDROcodone-acetaminophen 5-325 MG tablet Commonly known as:  NORCO/VICODIN Take 1 tablet by mouth every 6 (six) hours as needed for severe pain.   levothyroxine 100 MCG tablet Commonly known as:  SYNTHROID Take 100 mcg by mouth daily before breakfast.   lisinopril 30 MG tablet Commonly known as:  ZESTRIL Take 30 mg by mouth daily.   multivitamin with  minerals Tabs tablet Take 1 tablet by mouth daily. Start taking on:  January 05, 2019       Contact information for follow-up providers    Redmond School, MD. Schedule an appointment as soon as possible for a visit in 2 week(s).   Specialty:  Internal Medicine Why:  Hospital Follow Up  Contact information: [address] [place] [address] [phone]            Contact information for after-discharge care    [facility] .   Service:  Skilled Nursing Contact information: [address] [address] [phone]                 Allergies  Allergen Reactions  . Prednisone   . Zanaflex [Tizanidine Hcl]     Makes heart race   Allergies as of 01/04/2019      Reactions   Prednisone  Zanaflex [tizanidine Hcl]    Makes heart race      Medication List    STOP taking these medications   furosemide 20 MG tablet Commonly known as:  LASIX   HYDROmorphone 8 MG tablet Commonly known as:  DILAUDID   hydrOXYzine 25 MG tablet Commonly known as:  ATARAX/VISTARIL     TAKE these medications   acetaminophen 325 MG tablet Commonly known as:  TYLENOL Take 2 tablets (650 mg total) by mouth every 6 (six) hours as needed for mild pain (or Fever >/= 101).   albuterol 108 (90 Base) MCG/ACT inhaler Commonly known as:  VENTOLIN HFA Inhale 2 puffs into the lungs every 6 (six) hours as needed for wheezing or shortness of breath.   ALPRAZolam 0.25 MG tablet Commonly known as:  XANAX Take 1 tablet (0.25 mg total) by mouth 3 (three) times daily as needed for anxiety. What changed:    medication strength  how much to take  when to take this  reasons to take this   aspirin 81 MG tablet Take 81 mg by mouth daily.   budesonide-formoterol 160-4.5 MCG/ACT inhaler Commonly known as:  SYMBICORT Inhale 2 puffs into the lungs 2 (two) times daily. Reported on 10/02/2015 What changed:  when to take  this   cefdinir 300 MG capsule Commonly known as:  OMNICEF Take 1 capsule (300 mg total) by mouth 2 (two) times daily for 3 days.   esomeprazole 40 MG capsule Commonly known as:  NEXIUM Take 1 capsule (40 mg total) by mouth daily at 12 noon.   feeding supplement (ENSURE ENLIVE) Liqd Take 237 mLs by mouth 2 (two) times daily between meals.   HYDROcodone-acetaminophen 5-325 MG tablet Commonly known as:  NORCO/VICODIN Take 1 tablet by mouth every 6 (six) hours as needed for severe pain.   levothyroxine 100 MCG tablet Commonly known as:  SYNTHROID Take 100 mcg by mouth daily before breakfast.   lisinopril 30 MG tablet Commonly known as:  ZESTRIL Take 30 mg by mouth daily.   multivitamin with minerals Tabs tablet Take 1 tablet by mouth daily. Start taking on:  January 05, 2019       Procedures/Studies: Dg Chest 2 View  Result Date: 01/02/2019 CLINICAL DATA:  Fall EXAM: CHEST - 2 VIEW COMPARISON:  02/28/2013 FINDINGS: Heart is upper limits normal in size. Mild peribronchial thickening and interstitial prominence, favor bronchitic changes. No confluent opacities, effusions or pneumothorax. No acute bony abnormality. IMPRESSION: Bronchitic changes. Electronically Signed   By: Rolm Baptise M.D.   On: 01/02/2019 22:00   Ct Head Wo Contrast  Result Date: 01/02/2019 CLINICAL DATA:  Fall, hit head EXAM: CT HEAD WITHOUT CONTRAST CT CERVICAL SPINE WITHOUT CONTRAST TECHNIQUE: Multidetector CT imaging of the head and cervical spine was performed following the standard protocol without intravenous contrast. Multiplanar CT image reconstructions of the cervical spine were also generated. COMPARISON:  MRI 10/16/2015 FINDINGS: CT HEAD FINDINGS Brain: There is atrophy and chronic small vessel disease changes. No acute intracranial abnormality. Specifically, no hemorrhage, hydrocephalus, mass lesion, acute infarction, or significant intracranial injury. Vascular: No hyperdense vessel or unexpected  calcification. Skull: No acute calvarial abnormality. Sinuses/Orbits: Visualized paranasal sinuses and mastoids clear. Orbital soft tissues unremarkable. Other: Small left frontal meningioma again noted, stable, 8 mm. CT CERVICAL SPINE FINDINGS Alignment: Slight anterolisthesis of C4 on C5 related to facet disease. Skull base and vertebrae: No acute fracture. No primary bone lesion or focal pathologic process. Soft tissues and spinal canal: No prevertebral  fluid or swelling. No visible canal hematoma. Disc levels: Diffuse disc space narrowing and spurring. Diffuse degenerative facet disease bilaterally. Upper chest: No acute findings.  Mild emphysema. Other: None IMPRESSION: Atrophy, chronic microvascular disease. No acute intracranial abnormality. Degenerative disc and facet disease in the cervical spine. No acute bony abnormality. Electronically Signed   By: Rolm Baptise M.D.   On: 01/02/2019 22:41   Ct Cervical Spine Wo Contrast  Result Date: 01/02/2019 CLINICAL DATA:  Fall, hit head EXAM: CT HEAD WITHOUT CONTRAST CT CERVICAL SPINE WITHOUT CONTRAST TECHNIQUE: Multidetector CT imaging of the head and cervical spine was performed following the standard protocol without intravenous contrast. Multiplanar CT image reconstructions of the cervical spine were also generated. COMPARISON:  MRI 10/16/2015 FINDINGS: CT HEAD FINDINGS Brain: There is atrophy and chronic small vessel disease changes. No acute intracranial abnormality. Specifically, no hemorrhage, hydrocephalus, mass lesion, acute infarction, or significant intracranial injury. Vascular: No hyperdense vessel or unexpected calcification. Skull: No acute calvarial abnormality. Sinuses/Orbits: Visualized paranasal sinuses and mastoids clear. Orbital soft tissues unremarkable. Other: Small left frontal meningioma again noted, stable, 8 mm. CT CERVICAL SPINE FINDINGS Alignment: Slight anterolisthesis of C4 on C5 related to facet disease. Skull base and vertebrae:  No acute fracture. No primary bone lesion or focal pathologic process. Soft tissues and spinal canal: No prevertebral fluid or swelling. No visible canal hematoma. Disc levels: Diffuse disc space narrowing and spurring. Diffuse degenerative facet disease bilaterally. Upper chest: No acute findings.  Mild emphysema. Other: None IMPRESSION: Atrophy, chronic microvascular disease. No acute intracranial abnormality. Degenerative disc and facet disease in the cervical spine. No acute bony abnormality. Electronically Signed   By: Rolm Baptise M.D.   On: 01/02/2019 22:41   Ct Abdomen Pelvis W Contrast  Result Date: 01/02/2019 CLINICAL DATA:  Status post fall. EXAM: CT ABDOMEN AND PELVIS WITH CONTRAST TECHNIQUE: Multidetector CT imaging of the abdomen and pelvis was performed using the standard protocol following bolus administration of intravenous contrast. CONTRAST:  141mL OMNIPAQUE IOHEXOL 300 MG/ML  SOLN COMPARISON:  None. FINDINGS: Lower chest: Unremarkable. Hepatobiliary: No suspicious focal abnormality within the liver parenchyma. Gallbladder surgically absent. Extrahepatic common duct measures up to 13 mm diameter and gradually tapers into the ampulla. Pancreas: There is prominence of the main pancreatic duct in the head of the pancreas with no ductal dilatation in the pancreatic tail. Scattered parenchymal calcification in the pancreas raises the question of chronic pancreatitis and a 12 mm cyst is identified in the pancreatic body, best visualized on coronal image 36 of series 6. Spleen: No splenomegaly. No focal mass lesion. Adrenals/Urinary Tract: No adrenal nodule or mass. Right kidney unremarkable. Tiny low-density lesions in the left kidney are too small to characterize but likely benign. No evidence for hydroureter. Bladder is markedly distended. Stomach/Bowel: Small hiatal hernia. Stomach otherwise unremarkable. Duodenum is normally positioned as is the ligament of Treitz. No small bowel wall  thickening. No small bowel dilatation. The terminal ileum is normal. Normal appendix best appreciated on coronal imaging.: Unremarkable with short segment transverse colon extending out through a midline ventral hernia. No complicating features. Diverticular changes are noted in the left colon without evidence of diverticulitis. Vascular/Lymphatic: There is abdominal aortic atherosclerosis without aneurysm. There is no gastrohepatic or hepatoduodenal ligament lymphadenopathy. No intraperitoneal or retroperitoneal lymphadenopathy. No pelvic sidewall lymphadenopathy. Reproductive: Unremarkable. Other: No intraperitoneal free fluid. Musculoskeletal: No worrisome lytic or sclerotic osseous abnormality. Mild compression deformity noted T12 superior endplate. IMPRESSION: 1. No acute findings in the abdomen or  pelvis. 2. Midline ventral hernia contains short segment of transverse colon without complicating features. 3. Mild extrahepatic biliary duct dilatation in this patient status post cholecystectomy. Correlation with liver function tests suggested. 4. 12 mm cystic lesion pancreatic body. Given lesion size and patient age, follow-up CT abdomen with contrast in 2 years recommended to re-evaluate. This recommendation follows ACR consensus guidelines: Management of Incidental Pancreatic Cysts: A White Paper of the ACR Incidental Findings Committee. J Am Coll Radiol 7035;00:938-182. 5.  Aortic Atherosclerois (ICD10-170.0) 6. Colonic diverticulosis without diverticulitis. 7. Mild T12 compression deformity. Please see lumbar spine CT reported separately. Electronically Signed   By: Misty Stanley M.D.   On: 01/02/2019 22:45   Ct L-spine No Charge  Result Date: 01/02/2019 CLINICAL DATA:  Fall EXAM: CT LUMBAR SPINE WITHOUT CONTRAST TECHNIQUE: Multidetector CT imaging of the lumbar spine was performed without intravenous contrast administration. Multiplanar CT image reconstructions were also generated. COMPARISON:  None.  FINDINGS: Segmentation: 5 lumbar type vertebrae. Alignment: Normal. Vertebrae: There is an acute wedge compression fracture of T12 with approximately 10-20% height loss. No retropulsion. The fracture involves the superior endplate and anterior wall. No other fracture. Paraspinal and other soft tissues: Please see dedicated report for CT abdomen pelvis. Disc levels: There is no large disc herniation or bony spinal canal stenosis. There is moderate right and mild left foraminal narrowing at L5-S1 due to disc space narrowing and endplate spurring. IMPRESSION: 1. Acute wedge compression fracture of T12 with approximately 10-20% height loss, involving the superior endplate and anterior wall. No retropulsion. 2. Moderate right and mild left foraminal narrowing at L5-S1 due to disc space narrowing and endplate spurring. Electronically Signed   By: Ulyses Jarred M.D.   On: 01/02/2019 22:40     Subjective: Pt says that she is feeling a lot better today.  Pt is agreeable to go to SNF today.   Discharge Exam: Vitals:   01/04/19 0633 01/04/19 0723  BP: (!) 145/77   Pulse: (!) 59   Resp: 17   Temp: 98.7 F (37.1 C)   SpO2: 94% 96%   Vitals:   01/03/19 1920 01/03/19 2259 01/04/19 0633 01/04/19 0723  BP:  129/73 (!) 145/77   Pulse:  67 (!) 59   Resp:   17   Temp:  98.4 F (36.9 C) 98.7 F (37.1 C)   TempSrc:  Oral Oral   SpO2: 96% 97% 94% 96%  Weight:      Height:       General exam: Emaciated appearing elderly female appears much older than stated age with a pronounced thoracic kyphosis. Respiratory system: Clear. No increased work of breathing. Cardiovascular system: S1 & S2 heard. No JVD, murmurs, gallops, clicks or pedal edema. Gastrointestinal system: Abdomen is nondistended, soft and nontender. Normal bowel sounds heard. Central nervous system: Alert and oriented. No focal neurological deficits. Extremities: no CCE.   The results of significant diagnostics from this hospitalization  (including imaging, microbiology, ancillary and laboratory) are listed below for reference.     Microbiology: Recent Results (from the past 240 hour(s))  Culture, blood (routine x 2)     Status: None (Preliminary result)   Collection Time: 01/02/19  8:29 PM  Result Value Ref Range Status   Specimen Description RIGHT ANTECUBITAL  Final   Special Requests   Final    BOTTLES DRAWN AEROBIC AND ANAEROBIC Blood Culture adequate volume   Culture   Final    NO GROWTH 2 DAYS Performed at [facility],  [address], [place], [place] [address]    Report Status PENDING  Incomplete  Culture, blood (routine x 2)     Status: None (Preliminary result)   Collection Time: 01/02/19  8:48 PM  Result Value Ref Range Status   Specimen Description BLOOD LEFT WRIST  Final   Special Requests   Final    BOTTLES DRAWN AEROBIC AND ANAEROBIC Blood Culture adequate volume   Culture   Final    NO GROWTH 2 DAYS Performed at [facility], [address] [place], [place] [address]    Report Status PENDING  Incomplete  Urine culture     Status: Abnormal (Preliminary result)   Collection Time: 01/02/19 11:01 PM  Result Value Ref Range Status   Specimen Description   Final    URINE, CLEAN CATCH Performed at [facility], [address] [place], [place] [address]    Special Requests   Final    NONE Performed at [facility], [address], [place],  [address]    Culture 20,000 COLONIES/mL GRAM NEGATIVE RODS (A)  Final   Report Status PENDING  Incomplete     Labs: BNP (last 3 results) No results for input(s): BNP in the last 8760 hours. Basic Metabolic Panel: Recent Labs  Lab 01/02/19 2029 01/04/19 0410  NA 136 137  K 3.5 3.3*  CL 101 109  CO2 23 22  GLUCOSE 126* 82  BUN 24* 17  CREATININE 0.89 0.69  CALCIUM 9.3 8.4*   Liver Function Tests: Recent Labs  Lab 01/02/19 2029 01/04/19 0410  AST 21 20  ALT 13 12  ALKPHOS 89 61  BILITOT 0.8 0.5  PROT 7.8 5.9*  ALBUMIN 4.0 3.0*   No  results for input(s): LIPASE, AMYLASE in the last 168 hours. No results for input(s): AMMONIA in the last 168 hours. CBC: Recent Labs  Lab 01/02/19 2029 01/03/19 0607 01/04/19 0410  WBC 24.8* 20.2* 12.6*  NEUTROABS 21.7* 16.6* 9.2*  HGB 14.8 13.0 12.7  HCT 46.1* 40.4 41.1  MCV 84.1 84.2 86.5  PLT 395 346 307   Cardiac Enzymes: Recent Labs  Lab 01/02/19 2029 01/03/19 0029 01/03/19 0607 01/04/19 0410  CKTOTAL 466*  --  502* 403*  TROPONINI 0.04* 0.04* 0.04*  --    BNP: Invalid input(s): POCBNP CBG: No results for input(s): GLUCAP in the last 168 hours. D-Dimer No results for input(s): DDIMER in the last 72 hours. Hgb A1c No results for input(s): HGBA1C in the last 72 hours. Lipid Profile No results for input(s): CHOL, HDL, LDLCALC, TRIG, CHOLHDL, LDLDIRECT in the last 72 hours. Thyroid function studies No results for input(s): TSH, T4TOTAL, T3FREE, THYROIDAB in the last 72 hours.  Invalid input(s): FREET3 Anemia work up No results for input(s): VITAMINB12, FOLATE, FERRITIN, TIBC, IRON, RETICCTPCT in the last 72 hours. Urinalysis    Component Value Date/Time   COLORURINE YELLOW 01/02/2019 2301   APPEARANCEUR HAZY (A) 01/02/2019 2301   LABSPEC 1.025 01/02/2019 2301   PHURINE 5.0 01/02/2019 2301   GLUCOSEU 50 (A) 01/02/2019 2301   HGBUR MODERATE (A) 01/02/2019 2301   BILIRUBINUR NEGATIVE 01/02/2019 2301   KETONESUR 20 (A) 01/02/2019 2301   PROTEINUR 100 (A) 01/02/2019 2301   NITRITE NEGATIVE 01/02/2019 2301   LEUKOCYTESUR NEGATIVE 01/02/2019 2301   Sepsis Labs Invalid input(s): PROCALCITONIN,  WBC,  LACTICIDVEN Microbiology Recent Results (from the past 240 hour(s))  Culture, blood (routine x 2)     Status: None (Preliminary result)   Collection Time: 01/02/19  8:29 PM  Result Value Ref Range Status   Specimen Description RIGHT ANTECUBITAL  Final   Special Requests   Final    BOTTLES DRAWN AEROBIC AND ANAEROBIC Blood Culture adequate volume   Culture    Final    NO GROWTH 2 DAYS Performed at [facility], [address], [place], [place] [address]    Report Status PENDING  Incomplete  Culture, blood (routine x 2)     Status: None (Preliminary result)   Collection Time: 01/02/19  8:48 PM  Result Value Ref Range Status   Specimen Description BLOOD LEFT WRIST  Final   Special Requests   Final    BOTTLES DRAWN AEROBIC AND ANAEROBIC Blood Culture adequate volume   Culture   Final    NO GROWTH 2 DAYS Performed at [facility], [address] [place], [place] [address]    Report Status PENDING  Incomplete  Urine culture     Status: Abnormal (Preliminary result)   Collection Time: 01/02/19 11:01 PM  Result Value Ref Range Status   Specimen Description   Final    URINE, CLEAN CATCH Performed at [facility], [address] [place], [place] [address]    Special Requests   Final    NONE Performed at [facility], [address] [place], [place] [address]    Culture 20,000 COLONIES/mL GRAM NEGATIVE RODS (A)  Final   Report Status PENDING  Incomplete    Time coordinating discharge:   SIGNED:  Irwin Brakeman, MD  [facility] 01/04/2019, 10:59 AM How to contact the [facility] Attending or Consulting provider [place] or covering provider during after hours [place], for this patient?  1. Check the care team in [facility] and look for a) attending/consulting [facility] provider listed and b) the [facility] team listed 2. Log into [email or web address] and use [facility] [place]'s universal password to access. If you do not have the password, please contact the hospital operator. 3. Locate the [facility] provider you are looking for under [facility] and page to a number that you can be directly reached. 4. If you still have difficulty reaching the provider, please page the [facility] (Director on Call) for the Hospitalists listed on amion for assistance.

## 2019-01-04 NOTE — Care Management (Signed)
Patient has [employer] , auth # [id number].  She has bed at [facility], can DC when medically ready.

## 2019-01-04 NOTE — TOC Transition Note (Signed)
Transition of Care [facility]) - CM/SW Discharge Note   Patient Details  Name: Michelle Sanders MRN: [id number] Date of Birth: [birth year]  Transition of Care [facility]) CM/SW Contact:  Trish Mage, LCSW Phone Number: 01/04/2019, 1:02 PM   Clinical Narrative:   Pt to d/c today.  Nursing staff and Marianna Fuss at [facility] alerted.    Final next level of care: Skilled Nursing Facility Barriers to Discharge: No Barriers Identified   Patient Goals and CMS Choice Patient states their goals for this hospitalization and ongoing recovery are:: PT wishes to go to rehabe and then discharged to home. CMS Medicare.gov Compare Post Acute Care list provided to:: Patient Choice offered to / list presented to : Patient  Discharge Placement   Existing PASRR number confirmed : 01/03/19          Patient chooses bed at: [facility] Patient to be transferred to facility by: nursing staff via tunnel Name of family member notified: N/A Patient and family notified of of transfer: 01/04/19  Discharge Plan and Services   Discharge Planning Services: CM Consult Post Acute Care Choice: [place]                    Social Determinants of Health (SDOH) Interventions     Readmission Risk Interventions No flowsheet data found.

## 2019-01-05 ENCOUNTER — Encounter: Payer: Self-pay | Admitting: Adult Health

## 2019-01-05 ENCOUNTER — Non-Acute Institutional Stay (SKILLED_NURSING_FACILITY): Payer: PPO | Admitting: Adult Health

## 2019-01-05 ENCOUNTER — Other Ambulatory Visit: Payer: Self-pay | Admitting: Adult Health

## 2019-01-05 DIAGNOSIS — E039 Hypothyroidism, unspecified: Secondary | ICD-10-CM

## 2019-01-05 DIAGNOSIS — E440 Moderate protein-calorie malnutrition: Secondary | ICD-10-CM | POA: Insufficient documentation

## 2019-01-05 DIAGNOSIS — E876 Hypokalemia: Secondary | ICD-10-CM | POA: Insufficient documentation

## 2019-01-05 DIAGNOSIS — K219 Gastro-esophageal reflux disease without esophagitis: Secondary | ICD-10-CM | POA: Diagnosis not present

## 2019-01-05 DIAGNOSIS — G894 Chronic pain syndrome: Secondary | ICD-10-CM | POA: Diagnosis not present

## 2019-01-05 DIAGNOSIS — S22080D Wedge compression fracture of T11-T12 vertebra, subsequent encounter for fracture with routine healing: Secondary | ICD-10-CM

## 2019-01-05 DIAGNOSIS — N390 Urinary tract infection, site not specified: Secondary | ICD-10-CM

## 2019-01-05 DIAGNOSIS — W19XXXD Unspecified fall, subsequent encounter: Secondary | ICD-10-CM | POA: Diagnosis not present

## 2019-01-05 DIAGNOSIS — F411 Generalized anxiety disorder: Secondary | ICD-10-CM

## 2019-01-05 DIAGNOSIS — I10 Essential (primary) hypertension: Secondary | ICD-10-CM | POA: Diagnosis not present

## 2019-01-05 DIAGNOSIS — B9620 Unspecified Escherichia coli [E. coli] as the cause of diseases classified elsewhere: Secondary | ICD-10-CM | POA: Insufficient documentation

## 2019-01-05 DIAGNOSIS — J449 Chronic obstructive pulmonary disease, unspecified: Secondary | ICD-10-CM

## 2019-01-05 LAB — URINE CULTURE: Culture: 20000 — AB

## 2019-01-05 MED ORDER — HYDROMORPHONE HCL 4 MG PO TABS: 4.0000 mg | ORAL_TABLET | Freq: Four times a day (QID) | ORAL | 0 refills | Status: DC

## 2019-01-05 NOTE — Progress Notes (Signed)
Location:    [place] Room Number: 157/W Place of Service:  SNF (31)   CODE STATUS: Full Code  Allergies  Allergen Reactions  . Prednisone   . Zanaflex [Tizanidine Hcl]     Makes heart race    Chief Complaint  Patient presents with  . Hospitalization Follow-up    HPI:  She is a 68 year old woman who has been hospitalized from 01-02-19 through 01-04-19 after fall. She suffered a T 12 wedge compression fracture. She was treated for an elevated CK level with IVF which are resolving. She does have chronic pain for which she has been taking dilaudid 4 mg every 6 hours for a prolonged period of time. She has been on long term xanax and is presently taking this medication one to two times daily as needed for her chronic anxiety. She denies any changes in appetite. She states that the vicodin is not effective for her pain relief. She is here for short term rehab with her goal to return back home. She will need a walker upon discharge. She will continue to be followed for her chronic illnesses including: hypertension; copd; hypothyroidism.   Past Medical History:  Diagnosis Date  . Arthritis   . Ataxia   . Chronic pain   . COPD (chronic obstructive pulmonary disease) ([place])   . Falls   . Fibromyalgia   . Headache   . Hypertension   . Neck muscle weakness   . Proximal muscle weakness     Past Surgical History:  Procedure Laterality Date  . [place]  . CHOLECYSTECTOMY  1982    Social History   Socioeconomic History  . Marital status: Divorced    Spouse name: Not on file  . Number of children: 1  . Years of education: 65  . Highest education level: Not on file  Occupational History  . Occupation: Self-employed  Social Needs  . Financial resource strain: Not on file  . Food insecurity:    Worry: Not on file    Inability: Not on file  . Transportation needs:    Medical: Not on file    Non-medical: Not on file  Tobacco Use  . Smoking  status: Current Some Day Smoker  . Smokeless tobacco: Never Used  Substance and Sexual Activity  . Alcohol use: No    Alcohol/week: 0.0 standard drinks  . Drug use: No  . Sexual activity: Not on file  Lifestyle  . Physical activity:    Days per week: Not on file    Minutes per session: Not on file  . Stress: Not on file  Relationships  . Social connections:    Talks on phone: Not on file    Gets together: Not on file    Attends religious service: Not on file    Active member of club or organization: Not on file    Attends meetings of clubs or organizations: Not on file    Relationship status: Not on file  . Intimate partner violence:    Fear of current or ex partner: Not on file    Emotionally abused: Not on file    Physically abused: Not on file    Forced sexual activity: Not on file  Other Topics Concern  . Not on file  Social History Narrative   Lives alone   Caffeine use: 3 cups coffee per day   Family History  Problem Relation Age of Onset  . Stroke Sister   .  Arthritis Other   . Neuropathy Neg Hx       VITAL SIGNS BP 124/82   Pulse 64   Temp 98.2 F (36.8 C) (Oral)   Resp 20   Ht 5\' 2"  (1.575 m)   Wt 126 lb 6.4 oz (57.3 kg)   BMI 23.12 kg/m   Outpatient Encounter Medications as of 01/05/2019  Medication Sig  . acetaminophen (TYLENOL) 325 MG tablet Take 2 tablets (650 mg total) by mouth every 6 (six) hours as needed for mild pain (or Fever >/= 101).  Marland Kitchen albuterol (PROVENTIL HFA;VENTOLIN HFA) 108 (90 Base) MCG/ACT inhaler Inhale 2 puffs into the lungs every 6 (six) hours as needed for wheezing or shortness of breath.  . ALPRAZolam (XANAX) 0.25 MG tablet Take 1 tablet (0.25 mg total) by mouth 3 (three) times daily as needed for up to 14 days for anxiety.  Marland Kitchen aspirin 81 MG tablet Take 81 mg by mouth daily.  . budesonide-formoterol (SYMBICORT) 160-4.5 MCG/ACT inhaler Inhale 2 puffs into the lungs 2 (two) times daily. Reported on 10/02/2015  . cefdinir (OMNICEF)  300 MG capsule Take 1 capsule (300 mg total) by mouth 2 (two) times daily for 3 days.  Marland Kitchen esomeprazole (NEXIUM) 40 MG capsule Take 1 capsule (40 mg total) by mouth daily at 12 noon.  . feeding supplement, ENSURE ENLIVE, (ENSURE ENLIVE) LIQD Take 237 mLs by mouth 2 (two) times daily between meals.  Marland Kitchen HYDROcodone-acetaminophen (NORCO/VICODIN) 5-325 MG tablet Take 1 tablet by mouth every 6 (six) hours as needed for up to 6 days for severe pain.  Marland Kitchen levothyroxine (SYNTHROID, LEVOTHROID) 100 MCG tablet Take 100 mcg by mouth daily before breakfast.  . lisinopril (PRINIVIL,ZESTRIL) 30 MG tablet Take 30 mg by mouth daily.  . Multiple Vitamin (MULTIVITAMIN WITH MINERALS) TABS tablet Take 1 tablet by mouth daily.   No facility-administered encounter medications on file as of 01/05/2019.      SIGNIFICANT DIAGNOSTIC EXAMS  TODAY:   01-02-19: chest x-ray: Bronchitic changes.  01-02-19: ct of head and cervical spine:  Atrophy, chronic microvascular disease. No acute intracranial abnormality. Degenerative disc and facet disease in the cervical spine. No acute bony abnormality.  01-02-19: ct of lumbar spine:  1. Acute wedge compression fracture of T12 with approximately 10-20% height loss, involving the superior endplate and anterior wall. No retropulsion. 2. Moderate right and mild left foraminal narrowing at L5-S1 due to disc space narrowing and endplate spurring.  01-02-19: ct of abdomen and pelvis:  1. No acute findings in the abdomen or pelvis. 2. Midline ventral hernia contains short segment of transverse colon without complicating features. 3. Mild extrahepatic biliary duct dilatation in this patient status post cholecystectomy. Correlation with liver function tests suggested. 4. 12 mm cystic lesion pancreatic body. Given lesion size and patient age, follow-up CT abdomen with contrast in 2 years recommended to re-evaluate.  01-03-19: EEG: Normal electroencephalogram, awake and briefly drowsy. There  are no focal lateralizing or epileptiform features.   LABS REVIEWED TODAY:   01-02-19: wbc 24.8; hgb 14.8; hct 46.1; mcv 84.1; plt 395; glucose 126; bun 24; creat 0.89; k+ 3.5; na++ 136; ca 9.3 liver normal albumin 4.0 CK 466 blood culture: no growth fobt: neg; urine culture: 20,000 colonies e-coli  01-04-19: wbc 12.6; hgb 12.7; hct 41.1; mcv 86.5 plt 307; glucose 82; bun 17; creat 0.69; k+ 3.3; na++ 137; ca 8.4; total pro 5.9 liver normal albumin 3.0 CK 403   Review of Systems  Constitutional: Negative for malaise/fatigue.  Respiratory: Negative for cough and shortness of breath.   Cardiovascular: Negative for chest pain, palpitations and leg swelling.  Gastrointestinal: Negative for abdominal pain, constipation and heartburn.  Musculoskeletal: Positive for back pain. Negative for joint pain and myalgias.       Chronic back worse with wedge fracture   Skin: Negative.   Neurological: Negative for dizziness.  Psychiatric/Behavioral: The patient is not nervous/anxious.     Physical Exam Constitutional:      General: She is not in acute distress.    Appearance: She is underweight. She is not diaphoretic.  Neck:     Musculoskeletal: Neck supple.     Thyroid: No thyromegaly.  Cardiovascular:     Rate and Rhythm: Normal rate and regular rhythm.     Pulses: Normal pulses.     Heart sounds: Normal heart sounds.  Pulmonary:     Effort: Pulmonary effort is normal. No respiratory distress.     Breath sounds: Normal breath sounds.  Abdominal:     General: Bowel sounds are normal. There is no distension.     Palpations: Abdomen is soft.     Tenderness: There is no abdominal tenderness.  Musculoskeletal:     Right lower leg: No edema.     Left lower leg: No edema.     Comments: Is able to move all extremities Has scoliosis; kyphosis; limited ROM of neck   Lymphadenopathy:     Cervical: No cervical adenopathy.  Skin:    General: Skin is warm and dry.  Neurological:     Mental Status:  She is alert and oriented to person, place, and time.  Psychiatric:        Mood and Affect: Mood normal.     ASSESSMENT/ PLAN:  TODAY;   1. Essential hypertension; benign: is stable b/p 124/82 will continue lisinopril 30 mg daily asa 81 mg daily   2. COPD unspecified type: is stable will continue symbicort 160/4.5 mcg 2 puffs twice daily albuterol 2 puffs every 6 hours as needed  3. Acquired hypothyroidism: is stable will continue synthroid 100 mcg daily   4. GERD without esophagitis: is stable will continue nexium 40 mg daily   5. Compression fracture of T12 vertebra with routine healing subsequent encounter: is stable will continue therapy as directed. Will change her pain medication to dilaudid 4 mg every 6 hour for chronic pain management.    6. Chronic pain syndrome: her pain is better managed with hydromorphone 4 mg very 6 hours will change her pain medications.   7. Protein calorie malnutrition: is stable albumin 3.0 will continue supplements as directed.   8. Hypokalemia: is worse: k+ 3.3 will give k+ 40 meq today and will repeat k+ in the AM.   9. Fall at home: has mildly elevated CK: will continue therapy as directed to improve strength and independence with adls.   10. UTI: grew out 20,000 colonies: not clinically significant will stop omnicef   11. Generalized anxiety disorder: she is presently taking her xanax one to two times daily as needed; will continue xanax 0.25 mg three times daily as needed for 14 days will then re-evaluate.           MD is aware of resident's narcotic use and is in agreement with current plan of care. We will attempt to wean resident as apropriate   Ok Edwards NP [facility]  Contact [phone] Monday through Friday 8am- 5pm  After hours call [phone]

## 2019-01-06 ENCOUNTER — Encounter (HOSPITAL_COMMUNITY)
Admission: RE | Admit: 2019-01-06 | Discharge: 2019-01-06 | Disposition: A | Discharge status: Home / Self Care | Payer: PPO | Source: Skilled Nursing Facility | Attending: Adult Health | Admitting: Adult Health

## 2019-01-06 ENCOUNTER — Non-Acute Institutional Stay (SKILLED_NURSING_FACILITY): Payer: PPO | Admitting: Internal Medicine

## 2019-01-06 ENCOUNTER — Encounter: Payer: Self-pay | Admitting: Internal Medicine

## 2019-01-06 DIAGNOSIS — J449 Chronic obstructive pulmonary disease, unspecified: Secondary | ICD-10-CM | POA: Diagnosis not present

## 2019-01-06 DIAGNOSIS — B9620 Unspecified Escherichia coli [E. coli] as the cause of diseases classified elsewhere: Secondary | ICD-10-CM

## 2019-01-06 DIAGNOSIS — F411 Generalized anxiety disorder: Secondary | ICD-10-CM | POA: Diagnosis not present

## 2019-01-06 DIAGNOSIS — M797 Fibromyalgia: Secondary | ICD-10-CM | POA: Diagnosis not present

## 2019-01-06 DIAGNOSIS — G894 Chronic pain syndrome: Secondary | ICD-10-CM | POA: Diagnosis not present

## 2019-01-06 DIAGNOSIS — I10 Essential (primary) hypertension: Secondary | ICD-10-CM | POA: Insufficient documentation

## 2019-01-06 DIAGNOSIS — N390 Urinary tract infection, site not specified: Secondary | ICD-10-CM

## 2019-01-06 DIAGNOSIS — E039 Hypothyroidism, unspecified: Secondary | ICD-10-CM | POA: Diagnosis not present

## 2019-01-06 DIAGNOSIS — S22080D Wedge compression fracture of T11-T12 vertebra, subsequent encounter for fracture with routine healing: Secondary | ICD-10-CM

## 2019-01-06 LAB — POTASSIUM: Potassium: 4.2 mmol/L (ref 3.5–5.1)

## 2019-01-06 NOTE — Progress Notes (Signed)
Provider:  Veleta Miners, MD Location:  [place] Room Number: [place] of Service:  SNF (31)  PCP: Redmond School, MD Patient Care Team: Redmond School, MD as PCP - General (Internal Medicine)  Extended Emergency Contact Information Primary Emergency Contact: Hall,Nancy Address: [address]          [place], [place] [address] [place] Phone: [phone] Relation: Sister  Code Status: Full Code Goals of Care: Advanced Directive information Advanced Directives 01/06/2019  Does Patient Have a Medical Advance Directive? No  Type of Advance Directive -  Does patient want to make changes to medical advance directive? No - Patient declined  Would patient like information on creating a medical advance directive? -      Chief Complaint  Patient presents with  . New Admit To SNF    Admission    HPI: Patient is a 68 y.o. female seen today for admission to SNF  For therapy Patient was in the hospital from 04/19-04/21 after a fall at home leading to T12 compression fracture.  Patient has a history of fibromyalgia, chronic pain on Dilaudid, anxiety and depression, hypothyroidism, hypertension, hyperlipidemia, COPD still smokes.  Patient states that she was not feeling well at home and was not eating well.  She got up to go to the bathroom and then fell.  She did hit her head.  She said she was in so much pain but could not get up.  Her sister came to check on her next day and called the EMS. Her CT scan of head just showed chronic microvascular disease and atrophy with no acute change.  CT of the spine showed acute wedge compression fracture of T12.  CT of abdomen was also negative for any acute process.  She did have a cystic lesion in her pancreas which needs a follow-up in 2 years. Patient is now in SNF for therapy.  She is was feeling really good this morning and is already able to do her transfers and walk with mild assist.  Her pain  seems to be controlled on Dilaudid. Patient has been on chronic Dilaudid for her fibromyalgia for past 10 to 15 years.  She follows very closely with her PCP.  She also takes Xanax as needed She lives by herself and is active and was driving before this incident. Denies any Cough , Fever or SOB  Past Medical History:  Diagnosis Date  . Arthritis   . Ataxia   . Chronic pain   . COPD (chronic obstructive pulmonary disease) ([place])   . Falls   . Fibromyalgia   . Headache   . Hypertension   . Neck muscle weakness   . Proximal muscle weakness    Past Surgical History:  Procedure Laterality Date  . [place]  . CHOLECYSTECTOMY  1982    reports that she has been smoking. She has never used smokeless tobacco. She reports that she does not drink alcohol or use drugs. Social History   Socioeconomic History  . Marital status: Divorced    Spouse name: Not on file  . Number of children: 1  . Years of education: 101  . Highest education level: Not on file  Occupational History  . Occupation: Self-employed  Social Needs  . Financial resource strain: Not on file  . Food insecurity:    Worry: Not on file    Inability: Not on file  . Transportation needs:    Medical:  Not on file    Non-medical: Not on file  Tobacco Use  . Smoking status: Current Some Day Smoker  . Smokeless tobacco: Never Used  Substance and Sexual Activity  . Alcohol use: No    Alcohol/week: 0.0 standard drinks  . Drug use: No  . Sexual activity: Not on file  Lifestyle  . Physical activity:    Days per week: Not on file    Minutes per session: Not on file  . Stress: Not on file  Relationships  . Social connections:    Talks on phone: Not on file    Gets together: Not on file    Attends religious service: Not on file    Active member of club or organization: Not on file    Attends meetings of clubs or organizations: Not on file    Relationship status: Not on file  . Intimate partner violence:     Fear of current or ex partner: Not on file    Emotionally abused: Not on file    Physically abused: Not on file    Forced sexual activity: Not on file  Other Topics Concern  . Not on file  Social History Narrative   Lives alone   Caffeine use: 3 cups coffee per day    Functional Status Survey:    Family History  Problem Relation Age of Onset  . Stroke Sister   . Arthritis Other   . Neuropathy Neg Hx     Health Maintenance  Topic Date Due  . MAMMOGRAM  02/04/2019 (Originally 05/30/2000)  . DEXA SCAN  02/04/2019 (Originally 05/31/2015)  . COLONOSCOPY  02/04/2019 (Originally 05/30/2000)  . TETANUS/TDAP  02/04/2019 (Originally 05/30/1969)  . Hepatitis C Screening  02/04/2019 (Originally 04-Oct-1950)  . PNA vac Low Risk Adult (1 of 2 - PCV13) 02/04/2019 (Originally 05/31/2015)  . INFLUENZA VACCINE  04/16/2019    Allergies  Allergen Reactions  . Prednisone   . Zanaflex [Tizanidine Hcl]     Makes heart race    Outpatient Encounter Medications as of 01/06/2019  Medication Sig  . acetaminophen (TYLENOL) 325 MG tablet Take 2 tablets (650 mg total) by mouth every 6 (six) hours as needed for mild pain (or Fever >/= 101).  Marland Kitchen albuterol (PROVENTIL HFA;VENTOLIN HFA) 108 (90 Base) MCG/ACT inhaler Inhale 2 puffs into the lungs every 6 (six) hours as needed for wheezing or shortness of breath.  . ALPRAZolam (XANAX) 0.25 MG tablet Take 1 tablet (0.25 mg total) by mouth 3 (three) times daily as needed for up to 14 days for anxiety.  Marland Kitchen aspirin 81 MG chewable tablet Chew 81 mg by mouth daily.   . budesonide-formoterol (SYMBICORT) 160-4.5 MCG/ACT inhaler Inhale 2 puffs into the lungs 2 (two) times daily. Reported on 10/02/2015  . esomeprazole (NEXIUM) 40 MG capsule Take 1 capsule (40 mg total) by mouth daily at 12 noon.  . feeding supplement, ENSURE ENLIVE, (ENSURE ENLIVE) LIQD Take 237 mLs by mouth 2 (two) times daily between meals.  Marland Kitchen HYDROmorphone (DILAUDID) 4 MG tablet Take 1 tablet (4 mg total)  by mouth every 6 (six) hours.  Marland Kitchen levothyroxine (SYNTHROID, LEVOTHROID) 100 MCG tablet Take 100 mcg by mouth daily before breakfast.  . lisinopril (PRINIVIL,ZESTRIL) 30 MG tablet Take 30 mg by mouth daily.  . Multiple Vitamin (MULTIVITAMIN WITH MINERALS) TABS tablet Take 1 tablet by mouth daily.  . NON FORMULARY Diet type:  NAS  . omeprazole (PRILOSEC) 20 MG capsule Take 20 mg by mouth daily.  . [  DISCONTINUED] cefdinir (OMNICEF) 300 MG capsule Take 1 capsule (300 mg total) by mouth 2 (two) times daily for 3 days. (Patient not taking: Reported on 01/06/2019)   No facility-administered encounter medications on file as of 01/06/2019.     Review of Systems  Constitutional: Positive for activity change.  HENT: Negative.   Respiratory: Negative.  Negative for cough.   Cardiovascular: Negative.   Gastrointestinal: Negative.   Genitourinary: Negative.   Musculoskeletal: Positive for back pain.  Skin: Negative.   Neurological: Positive for weakness.  Psychiatric/Behavioral: Negative.     Vitals:   01/06/19 1029  BP: 129/68  Pulse: 62  Resp: 18  Temp: 97.9 F (36.6 C)  Weight: 126 lb 3.2 oz (57.2 kg)  Height: 5\' 2"  (1.575 m)   Body mass index is 23.08 kg/m. Physical Exam Vitals signs reviewed.  Constitutional:      Appearance: Normal appearance.  HENT:     Head: Normocephalic.     Nose: Nose normal.     Mouth/Throat:     Mouth: Mucous membranes are moist.     Pharynx: Oropharynx is clear.  Eyes:     Pupils: Pupils are equal, round, and reactive to light.  Neck:     Musculoskeletal: Neck supple.  Cardiovascular:     Rate and Rhythm: Normal rate and regular rhythm.  Pulmonary:     Effort: Pulmonary effort is normal.     Breath sounds: Normal breath sounds.  Abdominal:     General: Abdomen is flat. Bowel sounds are normal.     Palpations: Abdomen is soft.  Musculoskeletal:        General: No swelling.  Skin:    General: Skin is warm and dry.  Neurological:     General:  No focal deficit present.     Mental Status: She is alert and oriented to person, place, and time.  Psychiatric:        Mood and Affect: Mood normal.        Thought Content: Thought content normal.        Judgment: Judgment normal.     Labs reviewed: Basic Metabolic Panel: Recent Labs    01/02/19 2029 01/04/19 0410 01/06/19 0500  NA 136 137  --   K 3.5 3.3* 4.2  CL 101 109  --   CO2 23 22  --   GLUCOSE 126* 82  --   BUN 24* 17  --   CREATININE 0.89 0.69  --   CALCIUM 9.3 8.4*  --    Liver Function Tests: Recent Labs    01/02/19 2029 01/04/19 0410  AST 21 20  ALT 13 12  ALKPHOS 89 61  BILITOT 0.8 0.5  PROT 7.8 5.9*  ALBUMIN 4.0 3.0*   No results for input(s): LIPASE, AMYLASE in the last 8760 hours. No results for input(s): AMMONIA in the last 8760 hours. CBC: Recent Labs    01/02/19 2029 01/03/19 0607 01/04/19 0410  WBC 24.8* 20.2* 12.6*  NEUTROABS 21.7* 16.6* 9.2*  HGB 14.8 13.0 12.7  HCT 46.1* 40.4 41.1  MCV 84.1 84.2 86.5  PLT 395 346 307   Cardiac Enzymes: Recent Labs    01/02/19 2029 01/03/19 0029 01/03/19 0607 01/04/19 0410  CKTOTAL 466*  --  502* 403*  TROPONINI 0.04* 0.04* 0.04*  --    BNP: Invalid input(s): POCBNP No results found for: HGBA1C Lab Results  Component Value Date   TSH 6.110 (H) 10/02/2015   Lab Results  Component Value Date  FQHKUVJD05 327 10/02/2015   Lab Results  Component Value Date   FOLATE 9.6 10/02/2015   No results found for: IRON, TIBC, FERRITIN  Imaging and Procedures obtained prior to SNF admission: No results found.  Assessment/Plan  Compression fracture of T12 vertebra  Pain seem to be controlled on Dilaudid Working with therapy Essential hypertension Controlled On Prinivil  COPD On Symbicort and Albuterol  Acquired hypothyroidism Continue on Synthyroid E-coli UTI Was treated in Hospital with 3 days of Anitbiotics  Generalized anxiety disorder Continue Xanax PRN  Fibromyalgia with  Chronic Pain syndrome On Dilaudid . Follows very closely with her PCP       Family/ staff Communication:   Labs/tests ordered: Total time spent in this patient care encounter was  45_  minutes; greater than 50% of the visit spent counseling patient and staff, reviewing records , Labs and coordinating care for problems addressed at this encounter.

## 2019-01-07 LAB — CULTURE, BLOOD (ROUTINE X 2)
Culture: NO GROWTH
Culture: NO GROWTH
Special Requests: ADEQUATE
Special Requests: ADEQUATE

## 2019-01-14 ENCOUNTER — Non-Acute Institutional Stay (SKILLED_NURSING_FACILITY): Payer: PPO | Admitting: Adult Health

## 2019-01-14 ENCOUNTER — Other Ambulatory Visit: Payer: Self-pay | Admitting: Adult Health

## 2019-01-14 ENCOUNTER — Encounter: Payer: Self-pay | Admitting: Adult Health

## 2019-01-14 DIAGNOSIS — G894 Chronic pain syndrome: Secondary | ICD-10-CM

## 2019-01-14 DIAGNOSIS — S22080D Wedge compression fracture of T11-T12 vertebra, subsequent encounter for fracture with routine healing: Secondary | ICD-10-CM | POA: Diagnosis not present

## 2019-01-14 DIAGNOSIS — J449 Chronic obstructive pulmonary disease, unspecified: Secondary | ICD-10-CM | POA: Diagnosis not present

## 2019-01-14 MED ORDER — ALBUTEROL SULFATE HFA 108 (90 BASE) MCG/ACT IN AERS: 2.0000 | INHALATION_SPRAY | Freq: Four times a day (QID) | RESPIRATORY_TRACT | 0 refills | Status: AC | PRN

## 2019-01-14 MED ORDER — ALPRAZOLAM 0.25 MG PO TABS: 0.2500 mg | ORAL_TABLET | Freq: Three times a day (TID) | ORAL | 0 refills | Status: AC | PRN

## 2019-01-14 MED ORDER — LISINOPRIL 30 MG PO TABS: 30.0000 mg | ORAL_TABLET | Freq: Every day | ORAL | 0 refills | Status: AC

## 2019-01-14 MED ORDER — LEVOTHYROXINE SODIUM 100 MCG PO TABS: 100.0000 ug | ORAL_TABLET | Freq: Every day | ORAL | 0 refills | Status: AC

## 2019-01-14 MED ORDER — BUDESONIDE-FORMOTEROL FUMARATE 160-4.5 MCG/ACT IN AERO: 2.0000 | INHALATION_SPRAY | Freq: Two times a day (BID) | RESPIRATORY_TRACT | 0 refills | Status: AC

## 2019-01-14 MED ORDER — HYDROMORPHONE HCL 4 MG PO TABS: 4.0000 mg | ORAL_TABLET | Freq: Four times a day (QID) | ORAL | 0 refills | Status: AC

## 2019-01-14 NOTE — Progress Notes (Signed)
Location:   [place] Room Number: [place] of Service:  SNF (31)    CODE STATUS: Full [place]  Allergies  Allergen Reactions  . Prednisone   . Zanaflex [Tizanidine Hcl]     Makes heart race    Chief Complaint  Patient presents with  . Discharge Note    Discharging to home on 01/17/2019    HPI:  She is being discharged to home with out home health. She is moving to [place]; and will need to re-establish her medicare provider upon her move. She does have good family support in [place]. She will need a front wheel walker and will need her prescriptions written. She will have a virtual MD follow up visit upon discharge. She had been hospitalized following a compression fracture. She was admitted to this facility for short term rehab. She is now ready to discharge to home.    Past Medical History:  Diagnosis Date  . Arthritis   . Ataxia   . Chronic pain   . COPD (chronic obstructive pulmonary disease) ([place])   . Falls   . Fibromyalgia   . Headache   . Hypertension   . Neck muscle weakness   . Proximal muscle weakness     Past Surgical History:  Procedure Laterality Date  . [place]  . CHOLECYSTECTOMY  1982    Social History   Socioeconomic History  . Marital status: Divorced    Spouse name: Not on file  . Number of children: 1  . Years of education: 100  . Highest education level: Not on file  Occupational History  . Occupation: Self-employed  Social Needs  . Financial resource strain: Not on file  . Food insecurity:    Worry: Not on file    Inability: Not on file  . Transportation needs:    Medical: Not on file    Non-medical: Not on file  Tobacco Use  . Smoking status: Current Some Day Smoker  . Smokeless tobacco: Never Used  Substance and Sexual Activity  . Alcohol use: No    Alcohol/week: 0.0 standard drinks  . Drug use: No  . Sexual activity: Not on file  Lifestyle  . Physical activity:    Days  per week: Not on file    Minutes per session: Not on file  . Stress: Not on file  Relationships  . Social connections:    Talks on phone: Not on file    Gets together: Not on file    Attends religious service: Not on file    Active member of club or organization: Not on file    Attends meetings of clubs or organizations: Not on file    Relationship status: Not on file  . Intimate partner violence:    Fear of current or ex partner: Not on file    Emotionally abused: Not on file    Physically abused: Not on file    Forced sexual activity: Not on file  Other Topics Concern  . Not on file  Social History Narrative   Lives alone   Caffeine use: 3 cups coffee per day   Family History  Problem Relation Age of Onset  . Stroke Sister   . Arthritis Other   . Neuropathy Neg Hx     VITAL SIGNS BP 105/62   Pulse 72   Temp 98.6 F (37 C)   Resp 20   Ht 5\' 2"  (1.575 m)  Wt 125 lb 3.2 oz (56.8 kg)   BMI 22.90 kg/m   Patient's Medications  New Prescriptions   No medications on file  Previous Medications   ACETAMINOPHEN (TYLENOL) 325 MG TABLET    Take 2 tablets (650 mg total) by mouth every 6 (six) hours as needed for mild pain (or Fever >/= 101).   ALBUTEROL (PROVENTIL HFA;VENTOLIN HFA) 108 (90 BASE) MCG/ACT INHALER    Inhale 2 puffs into the lungs every 6 (six) hours as needed for wheezing or shortness of breath.   ALPRAZOLAM (XANAX) 0.25 MG TABLET    Take 1 tablet (0.25 mg total) by mouth 3 (three) times daily as needed for up to 14 days for anxiety.   ASPIRIN 81 MG CHEWABLE TABLET    Chew 81 mg by mouth daily.    BUDESONIDE-FORMOTEROL (SYMBICORT) 160-4.5 MCG/ACT INHALER    Inhale 2 puffs into the lungs 2 (two) times daily. Reported on 10/02/2015   FEEDING SUPPLEMENT, ENSURE ENLIVE, (ENSURE ENLIVE) LIQD    Take 237 mLs by mouth 2 (two) times daily between meals.   HYDROMORPHONE (DILAUDID) 4 MG TABLET    Take 1 tablet (4 mg total) by mouth every 6 (six) hours.   LEVOTHYROXINE  (SYNTHROID, LEVOTHROID) 100 MCG TABLET    Take 100 mcg by mouth daily before breakfast.   LISINOPRIL (PRINIVIL,ZESTRIL) 30 MG TABLET    Take 30 mg by mouth daily.   MULTIPLE VITAMIN (MULTIVITAMIN WITH MINERALS) TABS TABLET    Take 1 tablet by mouth daily.   NON FORMULARY    Diet type:  NAS   OMEPRAZOLE (PRILOSEC) 20 MG CAPSULE    Take 20 mg by mouth daily.  Modified Medications   No medications on file  Discontinued Medications   ESOMEPRAZOLE (NEXIUM) 40 MG CAPSULE    Take 1 capsule (40 mg total) by mouth daily at 12 noon.     SIGNIFICANT DIAGNOSTIC EXAMS  PREVIOUS:   01-02-19: chest x-ray: Bronchitic changes.  01-02-19: ct of head and cervical spine:  Atrophy, chronic microvascular disease. No acute intracranial abnormality. Degenerative disc and facet disease in the cervical spine. No acute bony abnormality.  01-02-19: ct of lumbar spine:  1. Acute wedge compression fracture of T12 with approximately 10-20% height loss, involving the superior endplate and anterior wall. No retropulsion. 2. Moderate right and mild left foraminal narrowing at L5-S1 due to disc space narrowing and endplate spurring.  01-02-19: ct of abdomen and pelvis:  1. No acute findings in the abdomen or pelvis. 2. Midline ventral hernia contains short segment of transverse colon without complicating features. 3. Mild extrahepatic biliary duct dilatation in this patient status post cholecystectomy. Correlation with liver function tests suggested. 4. 12 mm cystic lesion pancreatic body. Given lesion size and patient age, follow-up CT abdomen with contrast in 2 years recommended to re-evaluate.  01-03-19: EEG: Normal electroencephalogram, awake and briefly drowsy. There are no focal lateralizing or epileptiform features.  NO NEW EXAMS    LABS REVIEWED PREVOIUS:   01-02-19: wbc 24.8; hgb 14.8; hct 46.1; mcv 84.1; plt 395; glucose 126; bun 24; creat 0.89; k+ 3.5; na++ 136; ca 9.3 liver normal albumin 4.0 CK 466  blood culture: no growth fobt: neg; urine culture: 20,000 colonies e-coli  01-04-19: wbc 12.6; hgb 12.7; hct 41.1; mcv 86.5 plt 307; glucose 82; bun 17; creat 0.69; k+ 3.3; na++ 137; ca 8.4; total pro 5.9 liver normal albumin 3.0 CK 403  TODAY;  01-06-19: k+ 4.2    Review of  Systems  Constitutional: Negative for malaise/fatigue.  Respiratory: Negative for cough and shortness of breath.   Cardiovascular: Negative for chest pain, palpitations and leg swelling.  Gastrointestinal: Negative for abdominal pain, constipation and heartburn.  Musculoskeletal: Negative for back pain, joint pain and myalgias.  Skin: Negative.   Neurological: Negative for dizziness.  Psychiatric/Behavioral: The patient is not nervous/anxious.     Physical Exam Constitutional:      General: She is not in acute distress.    Appearance: She is underweight. She is not diaphoretic.  Neck:     Musculoskeletal: Neck supple.     Thyroid: No thyromegaly.  Cardiovascular:     Rate and Rhythm: Normal rate and regular rhythm.     Pulses: Normal pulses.     Heart sounds: Normal heart sounds.  Pulmonary:     Effort: Pulmonary effort is normal. No respiratory distress.     Breath sounds: Normal breath sounds.  Abdominal:     General: Bowel sounds are normal. There is no distension.     Palpations: Abdomen is soft.     Tenderness: There is no abdominal tenderness.  Musculoskeletal:     Right lower leg: No edema.     Left lower leg: No edema.     Comments: Is able to move all extremities Has scoliosis; kyphosis; limited ROM of neck    Lymphadenopathy:     Cervical: No cervical adenopathy.  Skin:    General: Skin is warm and dry.  Neurological:     Mental Status: She is alert and oriented to person, place, and time.  Psychiatric:        Mood and Affect: Mood normal.      ASSESSMENT/ PLAN:   Patient is being discharged with the following home health services:  None needed   Patient is being discharged with  the following durable medical equipment:  Front wheel walker in order to maintain her current level of independence with her adls   Patient has been advised to f/u with their PCP in 1-2 weeks to bring them up to date on their rehab stay.  Social services at facility was responsible for arranging this appointment.  Pt was provided with a 30 day supply of prescriptions for medications and refills must be obtained from their PCP.  For controlled substances, a more limited supply may be provided adequate until PCP appointment only.   A 30 day supply of her prescription medications have been sent to [address] with #10 xanax 0.25mg  tabs and #60 dilaudid 4 mg tabs.   Time spent with patient: 35 minutes: discussed medications; home health and dme; verbalized understanding.    Ok Edwards NP [facility]  Contact [phone] Monday through Friday 8am- 5pm  After hours call [phone]

## 2019-01-24 DIAGNOSIS — G894 Chronic pain syndrome: Secondary | ICD-10-CM | POA: Diagnosis not present

## 2019-02-17 DIAGNOSIS — G894 Chronic pain syndrome: Secondary | ICD-10-CM | POA: Diagnosis not present

## 2019-02-17 DIAGNOSIS — Z681 Body mass index (BMI) 19 or less, adult: Secondary | ICD-10-CM | POA: Diagnosis not present

## 2019-02-17 DIAGNOSIS — F329 Major depressive disorder, single episode, unspecified: Secondary | ICD-10-CM | POA: Diagnosis not present

## 2019-02-17 DIAGNOSIS — R1310 Dysphagia, unspecified: Secondary | ICD-10-CM | POA: Diagnosis not present

## 2019-02-18 ENCOUNTER — Other Ambulatory Visit: Payer: Self-pay | Admitting: Internal Medicine

## 2019-02-18 ENCOUNTER — Other Ambulatory Visit (HOSPITAL_COMMUNITY): Payer: Self-pay | Admitting: Internal Medicine

## 2019-02-18 DIAGNOSIS — R1310 Dysphagia, unspecified: Secondary | ICD-10-CM

## 2019-03-17 DIAGNOSIS — G894 Chronic pain syndrome: Secondary | ICD-10-CM | POA: Diagnosis not present

## 2019-03-25 ENCOUNTER — Other Ambulatory Visit: Payer: Self-pay | Admitting: Adult Health

## 2019-04-20 DIAGNOSIS — G894 Chronic pain syndrome: Secondary | ICD-10-CM | POA: Diagnosis not present

## 2019-04-20 DIAGNOSIS — E063 Autoimmune thyroiditis: Secondary | ICD-10-CM | POA: Diagnosis not present

## 2019-04-20 DIAGNOSIS — J449 Chronic obstructive pulmonary disease, unspecified: Secondary | ICD-10-CM | POA: Diagnosis not present

## 2019-05-17 DIAGNOSIS — G894 Chronic pain syndrome: Secondary | ICD-10-CM | POA: Diagnosis not present

## 2019-06-10 DIAGNOSIS — I10 Essential (primary) hypertension: Secondary | ICD-10-CM | POA: Diagnosis not present

## 2019-06-10 DIAGNOSIS — G894 Chronic pain syndrome: Secondary | ICD-10-CM | POA: Diagnosis not present

## 2019-07-15 DIAGNOSIS — G894 Chronic pain syndrome: Secondary | ICD-10-CM | POA: Diagnosis not present

## 2019-07-15 DIAGNOSIS — G4700 Insomnia, unspecified: Secondary | ICD-10-CM | POA: Diagnosis not present

## 2019-08-08 DIAGNOSIS — M79606 Pain in leg, unspecified: Secondary | ICD-10-CM | POA: Diagnosis not present

## 2019-08-08 DIAGNOSIS — M1991 Primary osteoarthritis, unspecified site: Secondary | ICD-10-CM | POA: Diagnosis not present

## 2019-08-08 DIAGNOSIS — G894 Chronic pain syndrome: Secondary | ICD-10-CM | POA: Diagnosis not present

## 2019-08-08 DIAGNOSIS — M25569 Pain in unspecified knee: Secondary | ICD-10-CM | POA: Diagnosis not present

## 2019-08-31 DIAGNOSIS — G894 Chronic pain syndrome: Secondary | ICD-10-CM | POA: Diagnosis not present

## 2019-08-31 DIAGNOSIS — M1991 Primary osteoarthritis, unspecified site: Secondary | ICD-10-CM | POA: Diagnosis not present

## 2019-09-09 DIAGNOSIS — A409 Streptococcal sepsis, unspecified: Secondary | ICD-10-CM | POA: Diagnosis not present

## 2019-09-09 DIAGNOSIS — M1990 Unspecified osteoarthritis, unspecified site: Secondary | ICD-10-CM | POA: Diagnosis not present

## 2019-09-09 DIAGNOSIS — I6523 Occlusion and stenosis of bilateral carotid arteries: Secondary | ICD-10-CM | POA: Diagnosis not present

## 2019-09-09 DIAGNOSIS — K838 Other specified diseases of biliary tract: Secondary | ICD-10-CM | POA: Diagnosis not present

## 2019-09-09 DIAGNOSIS — Z01818 Encounter for other preprocedural examination: Secondary | ICD-10-CM | POA: Diagnosis not present

## 2019-09-09 DIAGNOSIS — E2740 Unspecified adrenocortical insufficiency: Secondary | ICD-10-CM | POA: Diagnosis not present

## 2019-09-09 DIAGNOSIS — M797 Fibromyalgia: Secondary | ICD-10-CM | POA: Diagnosis not present

## 2019-09-09 DIAGNOSIS — G629 Polyneuropathy, unspecified: Secondary | ICD-10-CM | POA: Diagnosis not present

## 2019-09-09 DIAGNOSIS — R945 Abnormal results of liver function studies: Secondary | ICD-10-CM | POA: Diagnosis not present

## 2019-09-09 DIAGNOSIS — E039 Hypothyroidism, unspecified: Secondary | ICD-10-CM | POA: Diagnosis not present

## 2019-09-09 DIAGNOSIS — K30 Functional dyspepsia: Secondary | ICD-10-CM | POA: Diagnosis not present

## 2019-09-09 DIAGNOSIS — I272 Pulmonary hypertension, unspecified: Secondary | ICD-10-CM | POA: Diagnosis not present

## 2019-09-09 DIAGNOSIS — D473 Essential (hemorrhagic) thrombocythemia: Secondary | ICD-10-CM | POA: Diagnosis not present

## 2019-09-09 DIAGNOSIS — R911 Solitary pulmonary nodule: Secondary | ICD-10-CM | POA: Diagnosis not present

## 2019-09-09 DIAGNOSIS — R918 Other nonspecific abnormal finding of lung field: Secondary | ICD-10-CM | POA: Diagnosis not present

## 2019-09-09 DIAGNOSIS — E871 Hypo-osmolality and hyponatremia: Secondary | ICD-10-CM | POA: Diagnosis not present

## 2019-09-09 DIAGNOSIS — G459 Transient cerebral ischemic attack, unspecified: Secondary | ICD-10-CM | POA: Diagnosis not present

## 2019-09-09 DIAGNOSIS — Z23 Encounter for immunization: Secondary | ICD-10-CM | POA: Diagnosis not present

## 2019-09-09 DIAGNOSIS — J984 Other disorders of lung: Secondary | ICD-10-CM | POA: Diagnosis not present

## 2019-09-09 DIAGNOSIS — K439 Ventral hernia without obstruction or gangrene: Secondary | ICD-10-CM | POA: Diagnosis not present

## 2019-09-09 DIAGNOSIS — R6521 Severe sepsis with septic shock: Secondary | ICD-10-CM | POA: Diagnosis not present

## 2019-09-09 DIAGNOSIS — R7881 Bacteremia: Secondary | ICD-10-CM | POA: Diagnosis not present

## 2019-09-09 DIAGNOSIS — R0602 Shortness of breath: Secondary | ICD-10-CM | POA: Diagnosis not present

## 2019-09-09 DIAGNOSIS — F419 Anxiety disorder, unspecified: Secondary | ICD-10-CM | POA: Diagnosis not present

## 2019-09-09 DIAGNOSIS — B179 Acute viral hepatitis, unspecified: Secondary | ICD-10-CM | POA: Diagnosis not present

## 2019-09-09 DIAGNOSIS — E559 Vitamin D deficiency, unspecified: Secondary | ICD-10-CM | POA: Diagnosis not present

## 2019-09-09 DIAGNOSIS — D509 Iron deficiency anemia, unspecified: Secondary | ICD-10-CM | POA: Diagnosis not present

## 2019-09-09 DIAGNOSIS — G894 Chronic pain syndrome: Secondary | ICD-10-CM | POA: Diagnosis not present

## 2019-09-09 DIAGNOSIS — I959 Hypotension, unspecified: Secondary | ICD-10-CM | POA: Diagnosis not present

## 2019-09-09 DIAGNOSIS — Z20828 Contact with and (suspected) exposure to other viral communicable diseases: Secondary | ICD-10-CM | POA: Diagnosis not present

## 2019-09-09 DIAGNOSIS — I97191 Other postprocedural cardiac functional disturbances following other surgery: Secondary | ICD-10-CM | POA: Diagnosis not present

## 2019-09-09 DIAGNOSIS — J441 Chronic obstructive pulmonary disease with (acute) exacerbation: Secondary | ICD-10-CM | POA: Diagnosis not present

## 2019-09-09 DIAGNOSIS — C3411 Malignant neoplasm of upper lobe, right bronchus or lung: Secondary | ICD-10-CM | POA: Diagnosis not present

## 2019-09-09 DIAGNOSIS — J9601 Acute respiratory failure with hypoxia: Secondary | ICD-10-CM | POA: Diagnosis not present

## 2019-09-09 DIAGNOSIS — N179 Acute kidney failure, unspecified: Secondary | ICD-10-CM | POA: Diagnosis not present

## 2019-09-09 DIAGNOSIS — J449 Chronic obstructive pulmonary disease, unspecified: Secondary | ICD-10-CM | POA: Diagnosis not present

## 2019-09-09 DIAGNOSIS — E860 Dehydration: Secondary | ICD-10-CM | POA: Diagnosis not present

## 2019-09-09 DIAGNOSIS — R410 Disorientation, unspecified: Secondary | ICD-10-CM | POA: Diagnosis not present

## 2019-09-09 DIAGNOSIS — R0902 Hypoxemia: Secondary | ICD-10-CM | POA: Diagnosis not present

## 2019-09-09 DIAGNOSIS — K5730 Diverticulosis of large intestine without perforation or abscess without bleeding: Secondary | ICD-10-CM | POA: Diagnosis not present

## 2019-09-09 DIAGNOSIS — I071 Rheumatic tricuspid insufficiency: Secondary | ICD-10-CM | POA: Diagnosis not present

## 2019-09-09 DIAGNOSIS — K7689 Other specified diseases of liver: Secondary | ICD-10-CM | POA: Diagnosis not present

## 2019-09-09 DIAGNOSIS — C3491 Malignant neoplasm of unspecified part of right bronchus or lung: Secondary | ICD-10-CM | POA: Diagnosis not present

## 2019-09-09 DIAGNOSIS — J90 Pleural effusion, not elsewhere classified: Secondary | ICD-10-CM | POA: Diagnosis not present

## 2019-09-09 DIAGNOSIS — N390 Urinary tract infection, site not specified: Secondary | ICD-10-CM | POA: Diagnosis not present

## 2019-09-29 DIAGNOSIS — F1010 Alcohol abuse, uncomplicated: Secondary | ICD-10-CM | POA: Diagnosis not present

## 2019-09-29 DIAGNOSIS — Z9981 Dependence on supplemental oxygen: Secondary | ICD-10-CM | POA: Diagnosis not present

## 2019-09-29 DIAGNOSIS — E039 Hypothyroidism, unspecified: Secondary | ICD-10-CM | POA: Diagnosis not present

## 2019-09-29 DIAGNOSIS — M1990 Unspecified osteoarthritis, unspecified site: Secondary | ICD-10-CM | POA: Diagnosis not present

## 2019-09-29 DIAGNOSIS — C3411 Malignant neoplasm of upper lobe, right bronchus or lung: Secondary | ICD-10-CM | POA: Diagnosis not present

## 2019-09-29 DIAGNOSIS — J449 Chronic obstructive pulmonary disease, unspecified: Secondary | ICD-10-CM | POA: Diagnosis not present

## 2019-09-29 DIAGNOSIS — F419 Anxiety disorder, unspecified: Secondary | ICD-10-CM | POA: Diagnosis not present

## 2019-09-29 DIAGNOSIS — E559 Vitamin D deficiency, unspecified: Secondary | ICD-10-CM | POA: Diagnosis not present

## 2019-09-29 DIAGNOSIS — Z79899 Other long term (current) drug therapy: Secondary | ICD-10-CM | POA: Diagnosis not present

## 2019-09-29 DIAGNOSIS — D649 Anemia, unspecified: Secondary | ICD-10-CM | POA: Diagnosis not present

## 2019-10-12 DIAGNOSIS — Z79899 Other long term (current) drug therapy: Secondary | ICD-10-CM | POA: Diagnosis not present
